# Patient Record
Sex: Female | Born: 1990 | Race: Black or African American | Hispanic: No | Marital: Single | Smoking: Never smoker
Health system: Southern US, Community
[De-identification: ages and names within clinical notes are randomized; demographics above are authoritative.]

## PROBLEM LIST (undated history)

## (undated) ENCOUNTER — Inpatient Hospital Stay: Payer: Self-pay

## (undated) DIAGNOSIS — D649 Anemia, unspecified: Secondary | ICD-10-CM

## (undated) DIAGNOSIS — J45909 Unspecified asthma, uncomplicated: Secondary | ICD-10-CM

## (undated) HISTORY — DX: Anemia, unspecified: D64.9

## (undated) HISTORY — PX: WISDOM TOOTH EXTRACTION: SHX21

---

## 2012-10-25 LAB — OB RESULTS CONSOLE HEPATITIS B SURFACE ANTIGEN: Hepatitis B Surface Ag: NEGATIVE

## 2012-10-25 LAB — OB RESULTS CONSOLE RPR: RPR: NONREACTIVE

## 2012-10-25 LAB — OB RESULTS CONSOLE RUBELLA ANTIBODY, IGM: RUBELLA: IMMUNE

## 2012-10-25 LAB — OB RESULTS CONSOLE ABO/RH: RH TYPE: POSITIVE

## 2012-10-25 LAB — OB RESULTS CONSOLE HIV ANTIBODY (ROUTINE TESTING): HIV: NONREACTIVE

## 2012-10-25 LAB — OB RESULTS CONSOLE ANTIBODY SCREEN: Antibody Screen: NEGATIVE

## 2012-11-12 ENCOUNTER — Inpatient Hospital Stay (HOSPITAL_COMMUNITY): Admission: AD | Admit: 2012-11-12 | Payer: Self-pay | Source: Ambulatory Visit | Admitting: Obstetrics & Gynecology

## 2013-05-06 LAB — OB RESULTS CONSOLE GC/CHLAMYDIA
Chlamydia: NEGATIVE
Gonorrhea: NEGATIVE

## 2013-05-12 LAB — OB RESULTS CONSOLE GBS: GBS: NEGATIVE

## 2013-05-29 ENCOUNTER — Encounter (HOSPITAL_COMMUNITY): Payer: Self-pay | Admitting: *Deleted

## 2013-05-29 ENCOUNTER — Inpatient Hospital Stay (HOSPITAL_COMMUNITY)
Admission: AD | Admit: 2013-05-29 | Discharge: 2013-05-29 | Disposition: A | Discharge status: Home / Self Care | Payer: Medicaid Other | Source: Ambulatory Visit | Attending: Obstetrics and Gynecology | Admitting: Obstetrics and Gynecology

## 2013-05-29 DIAGNOSIS — M549 Dorsalgia, unspecified: Secondary | ICD-10-CM

## 2013-05-29 DIAGNOSIS — O99891 Other specified diseases and conditions complicating pregnancy: Secondary | ICD-10-CM | POA: Insufficient documentation

## 2013-05-29 DIAGNOSIS — O9989 Other specified diseases and conditions complicating pregnancy, childbirth and the puerperium: Principal | ICD-10-CM

## 2013-05-29 NOTE — L&D Delivery Note (Signed)
Delivery Note At 12:11 PM a viable and healthy female was delivered via Vaginal, Spontaneous Delivery (Presentation: Left Occiput Anterior).  APGAR: 9, 9; weight .   Placenta status: Intact, Spontaneous.  Cord: 3 vessels with the following complications: None.  Cord pH: na  Anesthesia: Epidural  Episiotomy: None Lacerations: 2nd degree;Perineal Suture Repair: 2.0 vicryl rapide Est. Blood Loss (mL): 200  Mom to postpartum.  Baby to Couplet care / Skin to Skin.  Merrillyn Ackerley J 06/09/2013, 12:22 PM

## 2013-05-29 NOTE — MAU Note (Signed)
PT SAYS SHE STARTED HURTING BAD AT [facility].  VE IN OFFICE ON Monday- DR [name]    - CLOSED .   LAST SEX-  DEC. DENIES HSV AND MRSA.

## 2013-05-29 NOTE — MAU Provider Note (Signed)
  History     CSN: [id number]  Arrival date and time: 05/29/13 0151   No chief complaint on file.  HPI  Backache and ctx tonight - could not sleep  History reviewed. No pertinent past medical history.  History reviewed. No pertinent past surgical history.  History reviewed. No pertinent family history.  History  Substance Use Topics  . Smoking status: Never Smoker   . Smokeless tobacco: Not on file  . Alcohol Use: No    Allergies: Not on File  Prescriptions prior to admission  Medication Sig Dispense Refill  . ferrous fumarate (HEMOCYTE - 106 MG FE) 325 (106 FE) MG TABS tablet Take 1 tablet by mouth.      . Multiple Vitamin (MULTIVITAMIN) tablet Take 1 tablet by mouth daily.        ROS Physical Exam   Blood pressure 104/75, pulse 71, temperature 98 F (36.7 C), temperature source Oral, resp. rate 20, height 5\' 6"  (1.676 m), weight 76.204 kg (168 lb).  Physical Exam Alert and oriented NAD or pain Cervical exam by nurse - no cervical change from last exam in office  FHR baseline 120-125 - moderate variability / + accels Variable during cervical exam - questionable maternal versus loss contact  MAU Course  Procedures  NST - reactive Labor check - no evidence of labor  Assessment and Plan  38 weeks Backache - reviewed late gestation pain and etiology of back pain in late gestation Comfort measures reviewed  Instructed to call office number prior to hospital to avoid false labor trips Can offer comfort measures over phone to try prior to hospital visit Labor signs reviewed  DC home   Marlinda MikeBAILEY, Kwana Ringel 05/29/2013, 3:13 AM

## 2013-05-29 NOTE — Discharge Instructions (Signed)
Back Pain in Pregnancy Back pain during pregnancy is common. It happens in about half of all pregnancies. It is important for you and your baby that you remain active during your pregnancy.Back pain may be caused by several factors related to changes during your pregnancy.Fortunately, the pain is likely to get better after you deliver.  Low back pain usually occurs between the fifth and seventh months of pregnancy. It will become more constant in last 1-2 weeks prior to labor when the baby "drops" into the pelvis before labor.   CAUSES   When you are pregnant, your body produces a hormone called relaxin. This hormonemakes the ligaments connecting the low back and pubic bones more flexible.When your ligaments are loose, your muscles need to work harder to support your back. Soreness in your back can come from tired muscles.   As the baby grows, it puts pressure on the nerves and blood vessels in your pelvis. This can cause back pain.  As the baby grows and gets heavier during pregnancy, the uterus pushes the stomach muscles forward and changes your center of gravity.  SYMPTOMS  Lumbar pain during pregnancy Lumbar pain during pregnancy usually occurs at or above the waist in the center of the back. There may be pain and numbness that radiates into your leg or foot. This is similar to low back pain experienced by non-pregnant women. It usually increases with sitting for long periods of time, standing, or repetitive lifting. Tenderness may also be present in the muscles along your upper back.  Posterior pelvic pain during pregnancy Pain in the back of the pelvis is more common than lumbar pain in pregnancy. It is a deep pain felt in your side at the waistline, or across the tailbone (sacrum), or in both places. You may have pain on one or both sides. This pain can also go into the buttocks and backs of the upper thighs. Pubic and groin pain may also be present. The pain does not quickly resolve with  rest, and morning stiffness may also be present.   However, if you are at term with the pregnancy, constant low back pain can be the beginning of early labor, and you should monitor for labor signs after several days of back pain. HOME CARE INSTRUCTIONS  Do not stand in one place for long periods of time.  Do not wear high heels.  Sit in chairs with good posture. Use a pillow on your lower back if necessary.   Try sleeping on your side, preferably the left side, with a pillow or two between your legs.  Listen to your body when lifting. Squat down when picking up something from the floor. Do not bend over.  Eat a healthy diet.   Use heat or cold packs 3 to 4 times a day for 15 minutes to help with the pain.  Only take over-the-counter or prescription medicines for pain, discomfort - Tylenol 2 tablets every 6 hours if needed.    SEEK MEDICAL CARE IF:   You are not able to do most of your daily activities, even when taking the pain medicine you were given.  You need a referral to a physical therapist or chiropractor.  You want to try acupuncture. SEEK IMMEDIATE MEDICAL CARE IF:  You develop numbness, tingling, weakness, or problems with the use of your arms or legs.  You develop severe back pain that is no longer relieved with medicines.  You develop shortness of breath, dizziness, or fainting.  You develop  nausea, vomiting, or sweating.  Your water breaks or vaginal bleeding.  You have numbness that travels down your leg.  Back pain is fairly common during pregnancy.    Document Released: 08/23/2005 Document Revised: 08/07/2011 Document Reviewed: 10/04/2010 [facility] Patient Information 2014 [employer] [place].

## 2013-05-30 NOTE — MAU Provider Note (Signed)
Reviewed and agree with note and plan. V.Mitchell Iwanicki, MD  

## 2013-06-05 ENCOUNTER — Encounter (HOSPITAL_COMMUNITY): Payer: Self-pay | Admitting: *Deleted

## 2013-06-05 ENCOUNTER — Inpatient Hospital Stay (HOSPITAL_COMMUNITY): Payer: BC Managed Care – PPO

## 2013-06-05 ENCOUNTER — Inpatient Hospital Stay (HOSPITAL_COMMUNITY)
Admission: AD | Admit: 2013-06-05 | Discharge: 2013-06-05 | Disposition: A | Discharge status: Home / Self Care | Payer: BC Managed Care – PPO | Source: Ambulatory Visit | Attending: Obstetrics and Gynecology | Admitting: Obstetrics and Gynecology

## 2013-06-05 DIAGNOSIS — B3731 Acute candidiasis of vulva and vagina: Secondary | ICD-10-CM | POA: Insufficient documentation

## 2013-06-05 DIAGNOSIS — O2390 Unspecified genitourinary tract infection in pregnancy, unspecified trimester: Secondary | ICD-10-CM | POA: Insufficient documentation

## 2013-06-05 DIAGNOSIS — B373 Candidiasis of vulva and vagina: Secondary | ICD-10-CM | POA: Insufficient documentation

## 2013-06-05 DIAGNOSIS — O479 False labor, unspecified: Secondary | ICD-10-CM | POA: Insufficient documentation

## 2013-06-05 LAB — AMNISURE RUPTURE OF MEMBRANE (ROM) NOT AT ARMC: Amnisure ROM: POSITIVE

## 2013-06-05 MED ORDER — TERCONAZOLE 0.4 % VA CREA: 1.0000 | TOPICAL_CREAM | Freq: Every day | VAGINAL | Status: DC

## 2013-06-05 NOTE — MAU Note (Signed)
WHEN TRYING TO COLLECTING  AMNISURE- MET RESISTANCE-  SO PT TRIED-   COULD  NOT INSERT  Q-TIP WITHOUT HURTING- ONLY  ABOUT 2 INCHES- SO I TRIED -  STOPPED - REMOVE Q-TIP- BRIGHT RED BLOOD.  TO B-ROOM- AFTER URINATING - FLUID ON FLOOR-  NEG FOR FERN.    SENT AMNISURE-   BACK ON MONITOR.

## 2013-06-05 NOTE — H&P (Signed)
CC: " I think my water broke."  HPI: 23 yo G1 at 39'6 presents with question ROM since this am. Pt notes scant d/c vs fluid PV since then. Pt seen in office yesterday, treated w/ topical steroid for vulvitis. Pt notes no bleeding, no contractions, no fevers, no abominal pain.  Overall uncomplicated pregnancy  PE: Filed Vitals:   06/05/13 0911  BP: 111/80  Pulse: 81  Temp: 98.4 F (36.9 C)  TempSrc: Oral  Resp: 18  Height: 5\' 6"  (1.676 m)  Weight: 75.569 kg (166 lb 9.6 oz)   Gen: well appearing, no distress Back: no CVAT  Abd: no RUQ pain, gravid, no fundal tenderness GU: thick, white d/c in vagina. Pool neg. cvx FT ext os, int os closed, cvx long, vtx high in station  Toco: q 7min Fh: 130's, + accels, no decels, 10 beat var  U/s: AFI 10, vtx, BPP 8/8 Wet prep: yeast, scant WBC, numerous lactobacilli, no clud Dry slide" fern neg  amnisure- blood stained: positive   A/P: Yeast vaginitis, no evidence ROM. Amnisure erroneous due to blood. Defer to clinical exam, wet prep, AFI.  Reactive fetal testing F/u routine in office, planned IOL at 41 wks.   Allison [name] A. 06/05/2013 2:46 PM

## 2013-06-05 NOTE — MAU Note (Signed)
Pt reports her waer broke about 7am this morning. Clear fluid out. C/O mild contractions and reports good fetal movement

## 2013-06-05 NOTE — MAU Note (Addendum)
PT SAYS   SHE WAS ASLEEP AND AWOKE  WITH A GUSH-  AT 0700  CLEAR.       VE IN OFFICE - CLOSED -  DR [name] IS PRIMARY. DENIES HSV AND MRSA.

## 2013-06-09 ENCOUNTER — Inpatient Hospital Stay (HOSPITAL_COMMUNITY): Payer: Medicaid Other | Admitting: Anesthesiology

## 2013-06-09 ENCOUNTER — Encounter (HOSPITAL_COMMUNITY): Payer: Self-pay | Admitting: *Deleted

## 2013-06-09 ENCOUNTER — Inpatient Hospital Stay (HOSPITAL_COMMUNITY)
Admission: AD | Admit: 2013-06-09 | Discharge: 2013-06-11 | DRG: 775 | Disposition: A | Discharge status: Home / Self Care | Payer: Medicaid Other | Source: Ambulatory Visit | Attending: Obstetrics and Gynecology | Admitting: Obstetrics and Gynecology

## 2013-06-09 ENCOUNTER — Encounter (HOSPITAL_COMMUNITY): Payer: Medicaid Other | Admitting: Anesthesiology

## 2013-06-09 DIAGNOSIS — O9903 Anemia complicating the puerperium: Secondary | ICD-10-CM | POA: Diagnosis not present

## 2013-06-09 DIAGNOSIS — D649 Anemia, unspecified: Secondary | ICD-10-CM | POA: Diagnosis not present

## 2013-06-09 HISTORY — DX: Unspecified asthma, uncomplicated: J45.909

## 2013-06-09 LAB — CBC
HEMATOCRIT: 35.5 % — AB (ref 36.0–46.0)
HEMOGLOBIN: 11.6 g/dL — AB (ref 12.0–15.0)
MCH: 23.4 pg — ABNORMAL LOW (ref 26.0–34.0)
MCHC: 32.7 g/dL (ref 30.0–36.0)
MCV: 71.6 fL — ABNORMAL LOW (ref 78.0–100.0)
Platelets: 142 10*3/uL — ABNORMAL LOW (ref 150–400)
RBC: 4.96 MIL/uL (ref 3.87–5.11)
RDW: 14.4 % (ref 11.5–15.5)
WBC: 6.3 10*3/uL (ref 4.0–10.5)

## 2013-06-09 LAB — OB RESULTS CONSOLE RUBELLA ANTIBODY, IGM: Rubella: NON-IMMUNE/NOT IMMUNE

## 2013-06-09 LAB — RPR: RPR Ser Ql: NONREACTIVE

## 2013-06-09 LAB — AMNISURE RUPTURE OF MEMBRANE (ROM) NOT AT ARMC: Amnisure ROM: POSITIVE

## 2013-06-09 LAB — ABO/RH: ABO/RH(D): O POS

## 2013-06-09 MED ORDER — LACTATED RINGERS IV SOLN
500.0000 mL | Freq: Once | INTRAVENOUS | Status: AC
  Administered 2013-06-09: 500 mL via INTRAVENOUS

## 2013-06-09 MED ORDER — ONDANSETRON HCL 4 MG/2ML IJ SOLN: 4.0000 mg | Freq: Four times a day (QID) | INTRAMUSCULAR | Status: DC | PRN

## 2013-06-09 MED ORDER — TETANUS-DIPHTH-ACELL PERTUSSIS 5-2.5-18.5 LF-MCG/0.5 IM SUSP: 0.5000 mL | Freq: Once | INTRAMUSCULAR | Status: DC

## 2013-06-09 MED ORDER — DIPHENHYDRAMINE HCL 25 MG PO CAPS: 25.0000 mg | ORAL_CAPSULE | Freq: Four times a day (QID) | ORAL | Status: DC | PRN

## 2013-06-09 MED ORDER — DIPHENHYDRAMINE HCL 50 MG/ML IJ SOLN: 12.5000 mg | INTRAMUSCULAR | Status: DC | PRN

## 2013-06-09 MED ORDER — LACTATED RINGERS IV SOLN: 500.0000 mL | INTRAVENOUS | Status: DC | PRN

## 2013-06-09 MED ORDER — IBUPROFEN 600 MG PO TABS
600.0000 mg | ORAL_TABLET | Freq: Four times a day (QID) | ORAL | Status: DC
  Administered 2013-06-09 – 2013-06-11 (×7): 600 mg via ORAL
  Filled 2013-06-09 (×7): qty 1

## 2013-06-09 MED ORDER — WITCH HAZEL-GLYCERIN EX PADS: 1.0000 "application " | MEDICATED_PAD | CUTANEOUS | Status: DC | PRN

## 2013-06-09 MED ORDER — OXYTOCIN 40 UNITS IN LACTATED RINGERS INFUSION - SIMPLE MED
62.5000 mL/h | INTRAVENOUS | Status: DC
  Administered 2013-06-09: 999 mL/h via INTRAVENOUS
  Filled 2013-06-09: qty 1000

## 2013-06-09 MED ORDER — LACTATED RINGERS IV SOLN
INTRAVENOUS | Status: DC
  Administered 2013-06-09 (×3): via INTRAVENOUS

## 2013-06-09 MED ORDER — PRENATAL MULTIVITAMIN CH
1.0000 | ORAL_TABLET | Freq: Every day | ORAL | Status: DC
  Administered 2013-06-10 – 2013-06-11 (×2): 1 via ORAL
  Filled 2013-06-09 (×2): qty 1

## 2013-06-09 MED ORDER — OXYCODONE-ACETAMINOPHEN 5-325 MG PO TABS: 1.0000 | ORAL_TABLET | ORAL | Status: DC | PRN

## 2013-06-09 MED ORDER — EPHEDRINE 5 MG/ML INJ
10.0000 mg | INTRAVENOUS | Status: DC | PRN
  Filled 2013-06-09: qty 4
  Filled 2013-06-09: qty 2

## 2013-06-09 MED ORDER — CITRIC ACID-SODIUM CITRATE 334-500 MG/5ML PO SOLN: 30.0000 mL | ORAL | Status: DC | PRN

## 2013-06-09 MED ORDER — PHENYLEPHRINE 40 MCG/ML (10ML) SYRINGE FOR IV PUSH (FOR BLOOD PRESSURE SUPPORT)
80.0000 ug | PREFILLED_SYRINGE | INTRAVENOUS | Status: DC | PRN
  Filled 2013-06-09: qty 2
  Filled 2013-06-09: qty 10

## 2013-06-09 MED ORDER — ZOLPIDEM TARTRATE 5 MG PO TABS: 5.0000 mg | ORAL_TABLET | Freq: Every evening | ORAL | Status: DC | PRN

## 2013-06-09 MED ORDER — IBUPROFEN 600 MG PO TABS: 600.0000 mg | ORAL_TABLET | Freq: Four times a day (QID) | ORAL | Status: DC | PRN

## 2013-06-09 MED ORDER — SENNOSIDES-DOCUSATE SODIUM 8.6-50 MG PO TABS
2.0000 | ORAL_TABLET | ORAL | Status: DC
  Administered 2013-06-09 – 2013-06-10 (×2): 2 via ORAL
  Filled 2013-06-09 (×2): qty 2

## 2013-06-09 MED ORDER — FENTANYL 2.5 MCG/ML BUPIVACAINE 1/10 % EPIDURAL INFUSION (WH - ANES)
14.0000 mL/h | INTRAMUSCULAR | Status: DC | PRN
  Administered 2013-06-09: 14 mL/h via EPIDURAL
  Filled 2013-06-09: qty 125

## 2013-06-09 MED ORDER — METHYLERGONOVINE MALEATE 0.2 MG/ML IJ SOLN: 0.2000 mg | INTRAMUSCULAR | Status: DC | PRN

## 2013-06-09 MED ORDER — BENZOCAINE-MENTHOL 20-0.5 % EX AERO: 1.0000 "application " | INHALATION_SPRAY | CUTANEOUS | Status: DC | PRN

## 2013-06-09 MED ORDER — ONDANSETRON HCL 4 MG PO TABS: 4.0000 mg | ORAL_TABLET | ORAL | Status: DC | PRN

## 2013-06-09 MED ORDER — FLEET ENEMA 7-19 GM/118ML RE ENEM: 1.0000 | ENEMA | RECTAL | Status: DC | PRN

## 2013-06-09 MED ORDER — DIBUCAINE 1 % RE OINT: 1.0000 "application " | TOPICAL_OINTMENT | RECTAL | Status: DC | PRN

## 2013-06-09 MED ORDER — ACETAMINOPHEN 325 MG PO TABS: 650.0000 mg | ORAL_TABLET | ORAL | Status: DC | PRN

## 2013-06-09 MED ORDER — ONDANSETRON HCL 4 MG/2ML IJ SOLN: 4.0000 mg | INTRAMUSCULAR | Status: DC | PRN

## 2013-06-09 MED ORDER — LIDOCAINE HCL (PF) 1 % IJ SOLN
INTRAMUSCULAR | Status: DC | PRN
  Administered 2013-06-09 (×4): 4 mL

## 2013-06-09 MED ORDER — SIMETHICONE 80 MG PO CHEW: 80.0000 mg | CHEWABLE_TABLET | ORAL | Status: DC | PRN

## 2013-06-09 MED ORDER — LANOLIN HYDROUS EX OINT: TOPICAL_OINTMENT | CUTANEOUS | Status: DC | PRN

## 2013-06-09 MED ORDER — METHYLERGONOVINE MALEATE 0.2 MG PO TABS: 0.2000 mg | ORAL_TABLET | ORAL | Status: DC | PRN

## 2013-06-09 MED ORDER — OXYTOCIN BOLUS FROM INFUSION: 500.0000 mL | INTRAVENOUS | Status: DC

## 2013-06-09 MED ORDER — EPHEDRINE 5 MG/ML INJ
10.0000 mg | INTRAVENOUS | Status: DC | PRN
  Filled 2013-06-09: qty 2

## 2013-06-09 MED ORDER — PHENYLEPHRINE 40 MCG/ML (10ML) SYRINGE FOR IV PUSH (FOR BLOOD PRESSURE SUPPORT)
80.0000 ug | PREFILLED_SYRINGE | INTRAVENOUS | Status: DC | PRN
  Filled 2013-06-09: qty 2

## 2013-06-09 MED ORDER — LIDOCAINE HCL (PF) 1 % IJ SOLN
30.0000 mL | INTRAMUSCULAR | Status: DC | PRN
  Filled 2013-06-09 (×2): qty 30

## 2013-06-09 NOTE — H&P (Signed)
Allison [name] is a [age] y.o. female presenting for LOF and labor. Maternal Medical History:  Reason for admission: Rupture of membranes and contractions.   Contractions: Onset was 1-2 hours ago.   Frequency: regular.   Perceived severity is moderate.    Fetal activity: Perceived fetal activity is normal.   Last perceived fetal movement was within the past hour.    Prenatal complications: no prenatal complications Prenatal Complications - Diabetes: none.    OB History   Grav Para Term Preterm Abortions TAB SAB Ect Mult Living   1              Past Medical History  Diagnosis Date  . Asthma     exercise induced, inhaler use 8 years ago   Past Surgical History  Procedure Laterality Date  . Wisdom tooth extraction     Family History: family history is not on file. Social History:  reports that she has never smoked. She does not have any smokeless tobacco history on file. She reports that she does not drink alcohol or use illicit drugs.   Prenatal Transfer Tool  Maternal Diabetes: No Genetic Screening: Normal Maternal Ultrasounds/Referrals: Normal Fetal Ultrasounds or other Referrals:  None Maternal Substance Abuse:  No Significant Maternal Medications:  None Significant Maternal Lab Results:  None Other Comments:  None  Review of Systems  Constitutional: Negative.   HENT: Negative.   Cardiovascular: Negative.   Gastrointestinal: Negative.   Genitourinary: Negative.   All other systems reviewed and are negative.    Dilation: 1.5 Effacement (%): 60 Station: -3 Exam by:: L. Munford RN Blood pressure 113/81, pulse 83, temperature 98.3 F (36.8 C), temperature source Oral, resp. rate 16, height 5\' 7"  (1.702 m), weight 75.751 kg (167 lb), SpO2 100.00%. Maternal Exam:  Uterine Assessment: Contraction strength is moderate.  Contraction frequency is regular.   Abdomen: Patient reports no abdominal tenderness. Fetal presentation: vertex  Introitus: Normal vulva.  Normal vagina.  Pelvis: adequate for delivery.   Cervix: Cervix evaluated by digital exam.     Physical Exam  Constitutional: She is oriented to person, place, and time. She appears well-developed and well-nourished.  HENT:  Head: Normocephalic.  Neck: Normal range of motion.  Cardiovascular: Normal rate and regular rhythm.   Respiratory: Effort normal.  GI: Soft.  Genitourinary: Vagina normal and uterus normal.  Neurological: She is alert and oriented to person, place, and time.  Skin: Skin is warm and dry.  Psychiatric: She has a normal mood and affect.    Prenatal labs: ABO, Rh: O/Positive/-- (05/30 0000) Antibody: Negative (05/30 0000) Rubella: Immune (05/30 0000) RPR: Nonreactive (05/30 0000)  HBsAg: Negative (05/30 0000)  HIV: Non-reactive (05/30 0000)  GBS: Negative (12/15 0000)   Assessment/Plan: Early Labor LOF- ?meconium- pos amnisure Admit to L&D Desires epidural   Allison [name] 06/09/2013, 8:32 AM

## 2013-06-09 NOTE — Anesthesia Postprocedure Evaluation (Signed)
  Anesthesia Post-op Note  Patient: Allison [name]  Procedure(s) Performed: * No procedures listed *  Patient Location: PACU and Mother/Baby  Anesthesia Type:Epidural  Level of Consciousness: awake  Airway and Oxygen Therapy: Patient Spontanous Breathing  Post-op Pain: mild  Post-op Assessment: Patient's Cardiovascular Status Stable and Respiratory Function Stable  Post-op Vital Signs: stable  Complications: No apparent anesthesia complications

## 2013-06-09 NOTE — Progress Notes (Signed)
Allison [name] is a 23 y.o. G1P0 at 4750w3d by LMP admitted for active labor, rupture of membranes  Subjective: comfortable  Objective: BP 113/81  Pulse 83  Temp(Src) 98.3 F (36.8 C) (Oral)  Resp 16  Ht 5\' 7"  (1.702 m)  Wt 75.751 kg (167 lb)  BMI 26.15 kg/m2  SpO2 100%      FHT:  FHR: 125 bpm, variability: moderate,  accelerations:  Present,  decelerations:  Absent UC:   regular, every 3 minutes SVE:   Dilation: 1.5 Effacement (%): 60 Station: -3 Exam by:: L. Munford RN  Labs: Lab Results  Component Value Date   WBC 6.3 06/09/2013   HGB 11.6* 06/09/2013   HCT 35.5* 06/09/2013   MCV 71.6* 06/09/2013   PLT 142* 06/09/2013    Assessment / Plan: Spontaneous labor, progressing normally ? MSF  Labor: Progressing normally Preeclampsia:  no signs or symptoms of toxicity Fetal Wellbeing:  Category I Pain Control:  Epidural I/D:  n/a Anticipated MOD:  NSVD  Shahzain Kiester J 06/09/2013, 8:39 AM

## 2013-06-09 NOTE — Progress Notes (Signed)
Dr. Billy Coastaavon notified patient presents for a labor eval, contractions every 5 minutes. After arrival patient c/o of leaking fluid. Small amount of green fluid (looks like meconium stained fluid). Small sample fern negative. Fetal tracing nonreactive minimal variability with decels. Patient in left lateral position. SVE 1.5/60/-3. Orders for amnisure received and to continue monitor.

## 2013-06-09 NOTE — Lactation Note (Signed)
This note was copied from the chart of Boy Marleni Roddey. Lactation Consultation Note Initial visit at  10 hours of age. Mom rePincus Largeports not having milk and baby is sleepy.  [facility] given and discussed.  Hand expression reveals small amount of colostrum.  Assistance needed to attempt latch in football hold on left breast.  Baby has wide flanged lips with mouth wide open, but does not hold breast and suck.  Baby doesn't suck on gloved finger either.  Encouraged skin to skin with feeding cues.  Baby mouthed and nuzzled at the breast and then sleepy.  Hand expressed 3 mls and spoon fed to baby.  Baby extends tongue well with spoon feeding.  Hand pump used to help stimulate nipple.  Nipples are erect and invert with compression.  Mom to call for assist as needed.   Patient Name: Boy Pincus LargeKourtney Tyner ZOXWR'UToday's Date: 06/09/2013 Reason for consult: Initial assessment   Maternal Data    Feeding Feeding Type: Breast Fed Length of feed:  (no suck established)  LATCH Score/Interventions Latch: Too sleepy or reluctant, no latch achieved, no sucking elicited. Intervention(s): Skin to skin;Teach feeding cues;Waking techniques Intervention(s): Adjust position;Assist with latch  Audible Swallowing: None Intervention(s): Skin to skin;Hand expression  Type of Nipple: Everted at rest and after stimulation Intervention(s): Hand pump  Comfort (Breast/Nipple): Soft / non-tender     Hold (Positioning): No assistance needed to correctly position infant at breast. Intervention(s): Skin to skin;Breastfeeding basics reviewed;Support Pillows  LATCH Score: 6  Lactation Tools Discussed/Used Initiated by:: Franz DellJana Shoptaw RN  Date initiated:: 06/10/13   Consult Status Consult Status: Follow-up Date: 06/10/13 Follow-up type: In-patient    Beverely RisenShoptaw, Arvella MerlesJana Lynn 06/09/2013, 11:50 PM

## 2013-06-09 NOTE — Anesthesia Procedure Notes (Signed)
Epidural Patient location during procedure: OB Start time: 06/09/2013 7:04 AM  Staffing Performed by: anesthesiologist   Preanesthetic Checklist Completed: patient identified, site marked, surgical consent, pre-op evaluation, timeout performed, IV checked, risks and benefits discussed and monitors and equipment checked  Epidural Patient position: sitting Prep: site prepped and draped and DuraPrep Patient monitoring: continuous pulse ox and blood pressure Approach: midline Injection technique: LOR air  Needle:  Needle type: Tuohy  Needle gauge: 17 G Needle length: 9 cm and 9 Needle insertion depth: 5 cm cm Catheter type: closed end flexible Catheter size: 19 Gauge Catheter at skin depth: 10 cm Test dose: negative  Assessment Events: blood not aspirated, injection not painful, no injection resistance, negative IV test and no paresthesia  Additional Notes Discussed risk of headache, infection, bleeding, nerve injury and failed or incomplete block.  Patient voices understanding and wishes to proceed.  Epidural placed easily on first attempt.  No paresthesia.  Patient tolerated procedure well with no apparent complications.  A> Lucee Brissett, MDReason for block:procedure for pain

## 2013-06-09 NOTE — Progress Notes (Signed)
Allison LargeKourtney [name] is a 23 y.o. G1P0 at 5627w3d by LMP admitted for active labor, rupture of membranes  Subjective: Pushing well  Objective: BP 122/86  Pulse 87  Temp(Src) 98.7 F (37.1 C) (Oral)  Resp 20  Ht 5\' 7"  (1.702 m)  Wt 75.751 kg (167 lb)  BMI 26.15 kg/m2  SpO2 100%   Total I/O In: -  Out: 400 [Urine:400]  FHT:  FHR: 145 bpm, variability: moderate,  accelerations:  Present,  decelerations:  Present mild variables with pushing UC:   regular, every 3 minutes SVE:   10/100/+2  Labs: Lab Results  Component Value Date   WBC 6.3 06/09/2013   HGB 11.6* 06/09/2013   HCT 35.5* 06/09/2013   MCV 71.6* 06/09/2013   PLT 142* 06/09/2013    Assessment / Plan: Spontaneous labor, progressing normally MSF  Labor: Progressing normally Preeclampsia:  no signs or symptoms of toxicity Fetal Wellbeing:  Category I Pain Control:  Epidural I/D:  n/a Anticipated MOD:  NSVD  Allison [name] J 06/09/2013, 11:38 AM

## 2013-06-09 NOTE — MAU Note (Signed)
Pt states contractions started around 2240 this evening and have been 4 minutes apart since 0100.  Denies any VB or LOF.

## 2013-06-09 NOTE — Progress Notes (Signed)
Patient ID: Pincus LargeKourtney [name], female   DOB: 04/03/1991, 23 y.o.   MRN: [id number] Circumcision note: Parents counselled. Consent signed. Risks vs benefits of procedure discussed. Decreased risks of UTI, STDs and penile cancer noted. Time out done. Ring block with 1 ml 1% xylocaine without complications. Procedure with Gomco 1.3 without complications. EBL: minimal  Pt tolerated procedure well.

## 2013-06-09 NOTE — Anesthesia Preprocedure Evaluation (Signed)
Anesthesia Evaluation  Patient identified by MRN, date of birth, ID band Patient awake    Reviewed: Allergy & Precautions, H&P , NPO status , Patient's Chart, lab work & pertinent test results, reviewed documented beta blocker date and time   History of Anesthesia Complications Negative for: history of anesthetic complications  Airway Mallampati: III TM Distance: >3 FB Neck ROM: full    Dental  (+) Teeth Intact   Pulmonary asthma (exercise-induced.  No inhaler use in 8 years) ,  breath sounds clear to auscultation        Cardiovascular negative cardio ROS  Rhythm:regular Rate:Normal     Neuro/Psych negative neurological ROS  negative psych ROS   GI/Hepatic negative GI ROS, Neg liver ROS,   Endo/Other  negative endocrine ROS  Renal/GU negative Renal ROS  negative genitourinary   Musculoskeletal   Abdominal   Peds  Hematology negative hematology ROS (+) Thrombocytopenia - plt 142   Anesthesia Other Findings   Reproductive/Obstetrics (+) Pregnancy                           Anesthesia Physical Anesthesia Plan  ASA: II  Anesthesia Plan: Epidural   Post-op Pain Management:    Induction:   Airway Management Planned:   Additional Equipment:   Intra-op Plan:   Post-operative Plan:   Informed Consent: I have reviewed the patients History and Physical, chart, labs and discussed the procedure including the risks, benefits and alternatives for the proposed anesthesia with the patient or authorized representative who has indicated his/her understanding and acceptance.     Plan Discussed with:   Anesthesia Plan Comments:         Anesthesia Quick Evaluation

## 2013-06-10 LAB — CBC
HEMATOCRIT: 30.5 % — AB (ref 36.0–46.0)
HEMOGLOBIN: 10 g/dL — AB (ref 12.0–15.0)
MCH: 23.5 pg — ABNORMAL LOW (ref 26.0–34.0)
MCHC: 32.8 g/dL (ref 30.0–36.0)
MCV: 71.6 fL — ABNORMAL LOW (ref 78.0–100.0)
Platelets: 121 10*3/uL — ABNORMAL LOW (ref 150–400)
RBC: 4.26 MIL/uL (ref 3.87–5.11)
RDW: 14.3 % (ref 11.5–15.5)
WBC: 10.8 10*3/uL — AB (ref 4.0–10.5)

## 2013-06-10 MED ORDER — POLYSACCHARIDE IRON COMPLEX 150 MG PO CAPS
150.0000 mg | ORAL_CAPSULE | Freq: Every day | ORAL | Status: DC
  Administered 2013-06-10 – 2013-06-11 (×2): 150 mg via ORAL
  Filled 2013-06-10 (×2): qty 1

## 2013-06-10 NOTE — Progress Notes (Addendum)
PPD #1- SVD  Subjective:   Reports feeling well Tolerating po/ No nausea or vomiting Bleeding is moderate Pain controlled with Motrin and Percocet Up ad lib / ambulatory / voiding without problems Newborn: breastfeeding  / Circumcision: planning outpatient   Objective:   VS:  VS:  Filed Vitals:   06/09/13 1424 06/09/13 1528 06/09/13 1829 06/10/13 0555  BP: 119/81 101/69 108/74 101/67  Pulse: 78 101 94 76  Temp: 99 F (37.2 C) 99.8 F (37.7 C) 98.9 F (37.2 C) 98.2 F (36.8 C)  TempSrc: Oral Oral Oral Oral  Resp: 16 18 18 18   Height:      Weight:      SpO2:        LABS:  Recent Labs  06/09/13 0505 06/10/13 0540  WBC 6.3 10.8*  HGB 11.6* 10.0*  PLT 142* 121*   Blood type: --/--/O POS (01/12 0441) Rubella: Nonimmune (01/12 1334)   I&O: Intake/Output     01/12 0701 - 01/13 0700 01/13 0701 - 01/14 0700   Urine (mL/kg/hr) 400 (0.2)    Blood 200 (0.1)    Total Output 600     Net -600          Urine Occurrence 2 x      Physical Exam: Alert and oriented x3 Abdomen: soft, non-tender, non-distended  Fundus: firm, non-tender, U-1 Perineum: Well approximated, no significant erythema, edema, or drainage; healing well. Lochia: small Extremities: no edema, no calf pain or tenderness    Assessment:  PPD # 1G1P1001/ S/P:spontaneous vaginal, 2nd degree laceration Mild anemia  Doing well    Plan: Continue routine post partum orders Start Niferex daily Anticipate D/C home tomorrow   Allison LarryBHAMBRI, Doyal Saric, N MSN, CNM 06/10/2013, 10:44 AM

## 2013-06-11 MED ORDER — MEASLES, MUMPS & RUBELLA VAC ~~LOC~~ INJ
0.5000 mL | INJECTION | Freq: Once | SUBCUTANEOUS | Status: AC
  Administered 2013-06-11: 0.5 mL via SUBCUTANEOUS
  Filled 2013-06-11: qty 0.5

## 2013-06-11 MED ORDER — IBUPROFEN 600 MG PO TABS: 600.0000 mg | ORAL_TABLET | Freq: Three times a day (TID) | ORAL | Status: DC | PRN

## 2013-06-11 MED ORDER — OXYCODONE-ACETAMINOPHEN 5-325 MG PO TABS: 1.0000 | ORAL_TABLET | ORAL | Status: DC | PRN

## 2013-06-11 NOTE — Discharge Summary (Signed)
Reviewed and agree with note and plan. V.Shanvi Moyd, MD  

## 2013-06-11 NOTE — Progress Notes (Signed)
PPD 2 SVD  S:  Reports feeling well             Tolerating po/ No nausea or vomiting             Bleeding is light             Pain controlled with motrin and percocet             Up ad lib / ambulatory / voiding QS  Newborn breast feeding   O:               VS: BP 111/77  Pulse 86  Temp(Src) 97.9 F (36.6 C) (Oral)  Resp 18  Ht 5\' 7"  (1.702 m)  Wt 167 lb (75.751 kg)  BMI 26.15 kg/m2  SpO2 100%   LABS:              Recent Labs  06/09/13 0505 06/10/13 0540  WBC 6.3 10.8*  HGB 11.6* 10.0*  PLT 142* 121*               Blood type: --/--/O POS (01/12 0441)  Rubella: Nonimmune (01/12 1334)                             Physical Exam:             Alert and oriented X3  Lungs: Clear and unlabored  Heart: regular rate and rhythm / no mumurs  Abdomen: soft, non-tender, non-distended              Fundus: firm, non-tender, U-1  Perineum: mild edema  Lochia: light  Extremities: no edema, no calf pain or tenderness    A: PPD # 2   Doing well - stable status  P: Routine post partum orders  DC home  Marlinda MikeBAILEY, Hamdi Kley CNM, MSN, [facility]ACNM 06/11/2013, 9:56 AM

## 2013-06-11 NOTE — Discharge Summary (Signed)
Obstetric Discharge Summary  Reason for Admission: onset of labor Prenatal Procedures: none Intrapartum Procedures: spontaneous vaginal delivery Postpartum Procedures: none Complications-Operative and Postpartum: 2nd degree perineal laceration Hemoglobin  Date Value Range Status  06/10/2013 10.0* 12.0 - 15.0 g/dL Final     HCT  Date Value Range Status  06/10/2013 30.5* 36.0 - 46.0 % Final    Physical Exam:  General: alert, cooperative and no distress Lochia: appropriate Uterine Fundus: firm Incision: healing well DVT Evaluation: No evidence of DVT seen on physical exam.  Discharge Diagnoses: Term Pregnancy-delivered  Discharge Information: Date: 06/11/2013 Activity: pelvic rest Diet: routine Medications: PNV, Ibuprofen, Iron and Percocet Condition: stable Instructions: refer to practice specific booklet Discharge to: home Follow-up Information   Follow up with Allison R, MD. Schedule an appointment as soon as possible for a visit in 6 weeks.   Specialty:  Obstetrics and Gynecology   Contact information:   [address] [place] [address] [phone]       Newborn Data: Live born female  Birth Weight: 6 lb 10 oz (3005 g) APGAR: 9, 9  Home with mother.  Allison [name], Allison [name] 06/11/2013, 9:59 AM

## 2014-03-30 ENCOUNTER — Encounter (HOSPITAL_COMMUNITY): Payer: Self-pay | Admitting: *Deleted

## 2015-05-14 ENCOUNTER — Emergency Department
Admission: EM | Admit: 2015-05-14 | Discharge: 2015-05-14 | Disposition: A | Discharge status: Home / Self Care | Payer: PRIVATE HEALTH INSURANCE | Attending: Emergency Medicine | Admitting: Emergency Medicine

## 2015-05-14 ENCOUNTER — Encounter: Payer: Self-pay | Admitting: Emergency Medicine

## 2015-05-14 ENCOUNTER — Emergency Department: Payer: PRIVATE HEALTH INSURANCE

## 2015-05-14 ENCOUNTER — Ambulatory Visit: Payer: Medicaid Other | Admitting: Physician Assistant

## 2015-05-14 DIAGNOSIS — Z79899 Other long term (current) drug therapy: Secondary | ICD-10-CM | POA: Diagnosis not present

## 2015-05-14 DIAGNOSIS — R059 Cough, unspecified: Secondary | ICD-10-CM

## 2015-05-14 DIAGNOSIS — Z3202 Encounter for pregnancy test, result negative: Secondary | ICD-10-CM | POA: Diagnosis not present

## 2015-05-14 DIAGNOSIS — R05 Cough: Secondary | ICD-10-CM | POA: Insufficient documentation

## 2015-05-14 DIAGNOSIS — Z792 Long term (current) use of antibiotics: Secondary | ICD-10-CM | POA: Insufficient documentation

## 2015-05-14 DIAGNOSIS — R1012 Left upper quadrant pain: Secondary | ICD-10-CM | POA: Insufficient documentation

## 2015-05-14 LAB — CBC
HCT: 35.4 % (ref 35.0–47.0)
Hemoglobin: 10.8 g/dL — ABNORMAL LOW (ref 12.0–16.0)
MCH: 21.9 pg — ABNORMAL LOW (ref 26.0–34.0)
MCHC: 30.6 g/dL — ABNORMAL LOW (ref 32.0–36.0)
MCV: 71.6 fL — ABNORMAL LOW (ref 80.0–100.0)
PLATELETS: 224 10*3/uL (ref 150–440)
RBC: 4.94 MIL/uL (ref 3.80–5.20)
RDW: 14.6 % — AB (ref 11.5–14.5)
WBC: 6.5 10*3/uL (ref 3.6–11.0)

## 2015-05-14 LAB — POCT PREGNANCY, URINE: Preg Test, Ur: NEGATIVE

## 2015-05-14 LAB — COMPREHENSIVE METABOLIC PANEL WITH GFR
ALT: 11 U/L — ABNORMAL LOW (ref 14–54)
AST: 16 U/L (ref 15–41)
Albumin: 4.2 g/dL (ref 3.5–5.0)
Alkaline Phosphatase: 57 U/L (ref 38–126)
Anion gap: 8 (ref 5–15)
BUN: 5 mg/dL — ABNORMAL LOW (ref 6–20)
CO2: 24 mmol/L (ref 22–32)
Calcium: 8.9 mg/dL (ref 8.9–10.3)
Chloride: 105 mmol/L (ref 101–111)
Creatinine, Ser: 0.52 mg/dL (ref 0.44–1.00)
GFR calc Af Amer: >60 mL/min
GFR calc non Af Amer: >60 mL/min
Glucose, Bld: 90 mg/dL (ref 65–99)
Potassium: 3.5 mmol/L (ref 3.5–5.1)
Sodium: 137 mmol/L (ref 135–145)
Total Bilirubin: 0.9 mg/dL (ref 0.3–1.2)
Total Protein: 7.9 g/dL (ref 6.5–8.1)

## 2015-05-14 MED ORDER — SENNOSIDES-DOCUSATE SODIUM 8.6-50 MG PO TABS: 2.0000 | ORAL_TABLET | Freq: Every day | ORAL | Status: AC | PRN

## 2015-05-14 MED ORDER — KETOROLAC TROMETHAMINE 30 MG/ML IJ SOLN
30.0000 mg | Freq: Once | INTRAMUSCULAR | Status: AC
  Administered 2015-05-14: 30 mg via INTRAVENOUS
  Filled 2015-05-14: qty 1

## 2015-05-14 MED ORDER — SODIUM CHLORIDE 0.9 % IV BOLUS (SEPSIS)
1000.0000 mL | Freq: Once | INTRAVENOUS | Status: AC
  Administered 2015-05-14: 1000 mL via INTRAVENOUS

## 2015-05-14 MED ORDER — SIMETHICONE 80 MG PO CHEW: 80.0000 mg | CHEWABLE_TABLET | Freq: Four times a day (QID) | ORAL | Status: DC | PRN

## 2015-05-14 NOTE — ED Provider Notes (Signed)
[facility] Emergency Department Provider Note  ____________________________________________  Time seen: Approximately 11:55 AM  I have reviewed the triage vital signs and the nursing notes.   HISTORY  Chief Complaint Abdominal Pain    HPI Allison [name] is a 24 y.o. female , otherwise healthy, presenting with left upper quadrant pain. Patient reports that since yesterday she has had intermittent episodes of left upper quadrant pain described as "like contractions" that lasts for several seconds and resolve spontaneously. She eats a low fiber diet with a large amount of fried foods as a base of her diet. She denies nausea, vomiting, diarrhea, fever or chills, urinary symptoms or change in her vaginal discharge. LMP was 2 weeks ago. Patient also reports that she has been having hard stools and a decreased number of bowel movements.  Positive nonproductive cough w/ congestion but no sore throat or fever over the past few days.     Past Medical History  Diagnosis Date  . Asthma     exercise induced, inhaler use 8 years ago    Patient Active Problem List   Diagnosis Date Noted  . Postpartum care following vaginal delivery (1/12) 06/10/2013  . Indication for care in labor or delivery 06/09/2013    Past Surgical History  Procedure Laterality Date  . Wisdom tooth extraction      Current Outpatient Rx  Name  Route  Sig  Dispense  Refill  . ferrous fumarate (HEMOCYTE - 106 MG FE) 325 (106 FE) MG TABS tablet   Oral   Take 1 tablet by mouth.         Marland Kitchen. ibuprofen (ADVIL,MOTRIN) 600 MG tablet   Oral   Take 1 tablet (600 mg total) by mouth every 8 (eight) hours as needed.   30 tablet   0   . oxyCODONE-acetaminophen (PERCOCET/ROXICET) 5-325 MG per tablet   Oral   Take 1 tablet by mouth every 4 (four) hours as needed for severe pain (moderate - severe pain).   20 tablet   0   . Prenatal Vit-Fe Fumarate-FA (PRENATAL MULTIVITAMIN) TABS tablet   Oral    Take 1 tablet by mouth daily at 12 noon.         . senna-docusate (SENOKOT-S) 8.6-50 MG tablet   Oral   Take 2 tablets by mouth daily as needed for mild constipation.   20 tablet   0   . simethicone (GAS-X) 80 MG chewable tablet   Oral   Chew 1 tablet (80 mg total) by mouth 4 (four) times daily as needed for flatulence.   15 tablet   0   . terconazole (TERAZOL 7) 0.4 % vaginal cream   Vaginal   Place 1 applicator vaginally at bedtime.   45 g   0     Apply nightly for 7 nights.     Allergies Dust mite extract and Tree extract  No family history on file.  Social History Social History  Substance Use Topics  . Smoking status: Never Smoker   . Smokeless tobacco: None  . Alcohol Use: No    Review of Systems Constitutional: No fever/chills. No lightheadedness or syncope. Eyes: No visual changes. ENT: No sore throat. Cardiovascular: Denies chest pain, palpitations. Respiratory: Denies shortness of breath.  No cough. Gastrointestinal: Nausea blood upper quadrant abdominal pain.  No nausea, no vomiting.  No diarrhea.  Nausea of constipation. Genitourinary: Negative for dysuria. Negative for vaginal discharge. Musculoskeletal: Negative for back pain. Skin: Negative for rash. Neurological:  Negative for headaches, focal weakness or numbness.  10-point ROS otherwise negative.  ____________________________________________   PHYSICAL EXAM:  VITAL SIGNS: ED Triage Vitals  Enc Vitals Group     BP 05/14/15 1129 104/70 mmHg     Pulse Rate 05/14/15 1129 87     Resp 05/14/15 1129 20     Temp 05/14/15 1129 98.4 F (36.9 C)     Temp Source 05/14/15 1129 Oral     SpO2 05/14/15 1129 100 %     Weight 05/14/15 1127 140 lb (63.504 kg)     Height 05/14/15 1127  (1.702 m)     Head Cir --      Peak Flow --      Pain Score 05/14/15 1127 7     Pain Loc --      Pain Edu? --      Excl. in GC? --     Constitutional: Alert and oriented. Well appearing and in no acute  distress. Answer question appropriately. Eyes: Conjunctivae are normal.  EOMI. no scleral icterus. Head: Atraumatic. Nose: No congestion/rhinnorhea. Mouth/Throat: Mucous membranes are moist.  Neck: No stridor.  Supple.   Cardiovascular: Normal rate, regular rhythm. No murmurs, rubs or gallops. No palpable or reproducible pain over the lower left chest wall. Respiratory: Normal respiratory effort.  No retractions. Lungs CTAB.  No wheezes, rales or ronchi. Gastrointestinal: Soft, nondistended. No reproducible tenderness to palpation. No guarding or rebound, peritoneal signs. Negative Murphy sign. Musculoskeletal: No LE edema.  Neurologic:  Normal speech and language. No gross focal neurologic deficits are appreciated.  Skin:  Skin is warm, dry and intact. No rash noted. Psychiatric: Mood and affect are normal. Speech and behavior are normal.  Normal judgement.  ____________________________________________   LABS (all labs ordered are listed, but only abnormal results are displayed)  Labs Reviewed  CBC - Abnormal; Notable for the following:    Hemoglobin 10.8 (*)    MCV 71.6 (*)    MCH 21.9 (*)    MCHC 30.6 (*)    RDW 14.6 (*)    All other components within normal limits  COMPREHENSIVE METABOLIC PANEL - Abnormal; Notable for the following:    BUN 5 (*)    ALT 11 (*)    All other components within normal limits  POC URINE PREG, ED  POCT PREGNANCY, URINE   ____________________________________________  EKG  Not indicated ____________________________________________  RADIOLOGY  Dg Chest 2 View  05/14/2015  CLINICAL DATA:  Left-sided chest and abdominal pain for 1 day EXAM: CHEST - 2 VIEW COMPARISON:  None. FINDINGS: The heart size and mediastinal contours are within normal limits. Both lungs are clear. The visualized skeletal structures are unremarkable. IMPRESSION: No active disease. Electronically Signed   By: Alcide Clever M.D.   On: 05/14/2015 12:43     ____________________________________________   PROCEDURES  Procedure(s) performed: None  Critical Care performed: No ____________________________________________   INITIAL IMPRESSION / ASSESSMENT AND PLAN / ED COURSE  Pertinent labs & imaging results that were available during my care of the patient were reviewed by me and considered in my medical decision making (see chart for details).  24 y.o. female, otherwise healthy, presenting with 2 days of left upper quadrant sharp pains that last for several seconds in the setting of a low fiber, high fat diet and hard stools with decreased frequency. The most likely etiology of her pain is gas pain or constipation. It is much is likely that she has reflux, gallbladder disease, or  obstruction. She has recently had cough and cold symptoms I will get a chest x-ray to rule out that her pain which is not reproducible on my exam is due to an early pneumonia. I will treat her symptomatically and plan discharge.  ----------------------------------------- 1:15 PM on 05/14/2015 -----------------------------------------  The patient has a reassuring examination, vital signs, normal labs studies, and a negative chest x-ray. I will plan to discharge her home and have her follow-up with her PMD. She understands return precautions as well as fall instructions.  ____________________________________________  FINAL CLINICAL IMPRESSION(S) / ED DIAGNOSES  Final diagnoses:  Left upper quadrant pain  Cough      NEW MEDICATIONS STARTED DURING THIS VISIT:  New Prescriptions   SENNA-DOCUSATE (SENOKOT-S) 8.6-50 MG TABLET    Take 2 tablets by mouth daily as needed for mild constipation.   SIMETHICONE (GAS-X) 80 MG CHEWABLE TABLET    Chew 1 tablet (80 mg total) by mouth 4 (four) times daily as needed for flatulence.     Rockne Menghini, MD 05/14/15 1315

## 2015-05-14 NOTE — ED Notes (Signed)
Pt here with c/o lower abd pain that began yesterday, with nausea but no vomiting, states it feels like really bad cramps, concerned for fibroids.

## 2015-05-14 NOTE — ED Notes (Signed)
Patient out of room at this time.

## 2016-07-14 ENCOUNTER — Encounter: Payer: Self-pay | Admitting: Certified Nurse Midwife

## 2016-07-31 ENCOUNTER — Ambulatory Visit: Payer: Commercial Managed Care - PPO | Admitting: Obstetrics & Gynecology

## 2016-09-14 ENCOUNTER — Encounter: Payer: Self-pay | Admitting: Physician Assistant

## 2016-09-14 ENCOUNTER — Ambulatory Visit: Payer: Self-pay | Admitting: Physician Assistant

## 2016-09-14 VITALS — BP 107/63 | HR 95 | Temp 99.2°F

## 2016-09-14 DIAGNOSIS — J020 Streptococcal pharyngitis: Secondary | ICD-10-CM

## 2016-09-14 DIAGNOSIS — J029 Acute pharyngitis, unspecified: Secondary | ICD-10-CM

## 2016-09-14 LAB — POCT RAPID STREP A (OFFICE): Rapid Strep A Screen: POSITIVE — AB

## 2016-09-14 MED ORDER — AMOXICILLIN 875 MG PO TABS
875.0000 mg | ORAL_TABLET | Freq: Two times a day (BID) | ORAL | 0 refills | Status: DC
Start: 2016-09-14 — End: 2017-08-22

## 2016-09-14 NOTE — Progress Notes (Signed)
S: c/o sore throat for 2 days, + low grade temp, states she has a long hx of strep, gets it several times a year, has seen ENT for same problem in the past, no cough/congestion/v/d  O: vitals wnl, nad, throat red swollen, neck supple tonsillar glands are swollen, lungs c t a, cv rrr, q strep +  A: strep throat  P: amoxil  bid x 10d, will refer to ENT due to freq infections

## 2016-09-14 NOTE — Progress Notes (Addendum)
Referral to [facility] ENT has been faxed per [facility] authorization.  Patients appointment with Dr. Willeen Cass  Is 09/20/16 @ 9:45.  I called the patient and left her a message.

## 2017-05-29 NOTE — L&D Delivery Note (Signed)
     Delivery Note   Allison [name] is a 27 y.o. G2P2002 at 6519w2d Estimated Date of Delivery: 04/22/18  PRE-OPERATIVE DIAGNOSIS:  1) 2219w2d pregnancy.    POST-OPERATIVE DIAGNOSIS:  1) 419w2d pregnancy s/p Vaginal, Spontaneous   Delivery Type: Vaginal, Spontaneous    Delivery Anesthesia: Epidural   Labor Complications:   precipitous delivery     ESTIMATED BLOOD LOSS: 250 ml    FINDINGS:   1) female infant, Apgar scores of 8    at 1 minute and 9    at 5 minutes and a birthweight of 116.4  ounces.    2) Nuchal cord: No  SPECIMENS:   PLACENTA:   Appearance: Intact , 3 vessel cord, cord blood sample collected   Removal: Spontaneous      Disposition:   held per protocol then discarded.   DISPOSITION:  Infant to left in stable condition in the delivery room, with L&D personnel and mother,  NARRATIVE SUMMARY: Labor course:  Ms. Allison CruelKourtney L Irizarry is a [id number] at 5819w2d who presented for labor management.  She progressed well in labor with pitocin.  She received the appropriate epidural anesthesia and proceeded to complete dilation. She evidenced good maternal expulsive effort during the second stage. She went on to deliver a viable female infant. The placenta delivered without problems and was noted to be complete. A perineal and vaginal examination was performed. Lacerations:   None. The patient tolerated this well.   Doreene Burkennie Marcine Gadway, CNM  04/24/2018 7:31 PM

## 2017-08-22 ENCOUNTER — Encounter: Payer: Self-pay | Admitting: Certified Nurse Midwife

## 2017-08-22 ENCOUNTER — Ambulatory Visit (INDEPENDENT_AMBULATORY_CARE_PROVIDER_SITE_OTHER): Payer: Managed Care, Other (non HMO)

## 2017-08-22 ENCOUNTER — Ambulatory Visit: Payer: Managed Care, Other (non HMO) | Admitting: Certified Nurse Midwife

## 2017-08-22 VITALS — BP 118/63 | HR 79 | Ht 67.0 in | Wt 176.0 lb

## 2017-08-22 DIAGNOSIS — N926 Irregular menstruation, unspecified: Secondary | ICD-10-CM | POA: Diagnosis not present

## 2017-08-22 LAB — POCT URINE PREGNANCY: Preg Test, Ur: POSITIVE — AB

## 2017-08-22 NOTE — Patient Instructions (Signed)
Prenatal Care WHAT IS PRENATAL CARE? Prenatal care is the process of caring for a pregnant woman before she gives birth. Prenatal care makes sure that she and her baby remain as healthy as possible throughout pregnancy. Prenatal care may be provided by a midwife, family practice health care provider, or a childbirth and pregnancy specialist (obstetrician). Prenatal care may include physical examinations, testing, treatments, and education on nutrition, lifestyle, and social support services. WHY IS PRENATAL CARE SO IMPORTANT? Early and consistent prenatal care increases the chance that you and your baby will remain healthy throughout your pregnancy. This type of care also decreases a baby's risk of being born too early (prematurely), or being born smaller than expected (small for gestational age). Any underlying medical conditions you may have that could pose a risk during your pregnancy are discussed during prenatal care visits. You will also be monitored regularly for any new conditions that may arise during your pregnancy so they can be treated quickly and effectively. WHAT HAPPENS DURING PRENATAL CARE VISITS? Prenatal care visits may include the following: Discussion Tell your health care provider about any new signs or symptoms you have experienced since your last visit. These might include:  Nausea or vomiting.  Increased or decreased level of energy.  Difficulty sleeping.  Back or leg pain.  Weight changes.  Frequent urination.  Shortness of breath with physical activity.  Changes in your skin, such as the development of a rash or itchiness.  Vaginal discharge or bleeding.  Feelings of excitement or nervousness.  Changes in your baby's movements.  You may want to write down any questions or topics you want to discuss with your health care provider and bring them with you to your appointment. Examination During your first prenatal care visit, you will likely have a complete  physical exam. Your health care provider will often examine your vagina, cervix, and the position of your uterus, as well as check your heart, lungs, and other body systems. As your pregnancy progresses, your health care provider will measure the size of your uterus and your baby's position inside your uterus. He or she may also examine you for early signs of labor. Your prenatal visits may also include checking your blood pressure and, after about 10-12 weeks of pregnancy, listening to your baby's heartbeat. Testing Regular testing often includes:  Urinalysis. This checks your urine for glucose, protein, or signs of infection.  Blood count. This checks the levels of white and red blood cells in your body.  Tests for sexually transmitted infections (STIs). Testing for STIs at the beginning of pregnancy is routinely done and is required in many states.  Antibody testing. You will be checked to see if you are immune to certain illnesses, such as rubella, that can affect a developing fetus.  Glucose screen. Around 24-28 weeks of pregnancy, your blood glucose level will be checked for signs of gestational diabetes. Follow-up tests may be recommended.  Group B strep. This is a bacteria that is commonly found inside a woman's vagina. This test will inform your health care provider if you need an antibiotic to reduce the amount of this bacteria in your body prior to labor and childbirth.  Ultrasound. Many pregnant women undergo an ultrasound screening around 18-20 weeks of pregnancy to evaluate the health of the fetus and check for any developmental abnormalities.  HIV (human immunodeficiency virus) testing. Early in your pregnancy, you will be screened for HIV. If you are at high risk for HIV, this test may   be repeated during your third trimester of pregnancy.  You may be offered other testing based on your age, personal or family medical history, or other factors. HOW OFTEN SHOULD I PLAN TO SEE MY  HEALTH CARE PROVIDER FOR PRENATAL CARE? Your prenatal care check-up schedule depends on any medical conditions you have before, or develop during, your pregnancy. If you do not have any underlying medical conditions, you will likely be seen for checkups:  Monthly, during the first 6 months of pregnancy.  Twice a month during months 7 and 8 of pregnancy.  Weekly starting in the 9th month of pregnancy and until delivery.  If you develop signs of early labor or other concerning signs or symptoms, you may need to see your health care provider more often. Ask your health care provider what prenatal care schedule is best for you. WHAT CAN I DO TO KEEP MYSELF AND MY BABY AS HEALTHY AS POSSIBLE DURING MY PREGNANCY?  Take a prenatal vitamin containing 400 micrograms (0.4 mg) of folic acid every day. Your health care provider may also ask you to take additional vitamins such as iodine, vitamin D, iron, copper, and zinc.  Take 1500-2000 mg of calcium daily starting at your 20th week of pregnancy until you deliver your baby.  Make sure you are up to date on your vaccinations. Unless directed otherwise by your health care provider: ? You should receive a tetanus, diphtheria, and pertussis (Tdap) vaccination between the 27th and 36th week of your pregnancy, regardless of when your last Tdap immunization occurred. This helps protect your baby from whooping cough (pertussis) after he or she is born. ? You should receive an annual inactivated influenza vaccine (IIV) to help protect you and your baby from influenza. This can be done at any point during your pregnancy.  Eat a well-rounded diet that includes: ? Fresh fruits and vegetables. ? Lean proteins. ? Calcium-rich foods such as milk, yogurt, hard cheeses, and dark, leafy greens. ? Whole grain breads.  Do noteat seafood high in mercury, including: ? Swordfish. ? Tilefish. ? Shark. ? King mackerel. ? More than 6 oz tuna per week.  Do not  eat: ? Raw or undercooked meats or eggs. ? Unpasteurized foods, such as soft cheeses (brie, blue, or feta), juices, and milks. ? Lunch meats. ? Hot dogs that have not been heated until they are steaming.  Drink enough water to keep your urine clear or pale yellow. For many women, this may be 10 or more 8 oz glasses of water each day. Keeping yourself hydrated helps deliver nutrients to your baby and may prevent the start of pre-term uterine contractions.  Do not use any tobacco products including cigarettes, chewing tobacco, or electronic cigarettes. If you need help quitting, ask your health care provider.  Do not drink beverages containing alcohol. No safe level of alcohol consumption during pregnancy has been determined.  Do not use any illegal drugs. These can harm your developing baby or cause a miscarriage.  Ask your health care provider or pharmacist before taking any prescription or over-the-counter medicines, herbs, or supplements.  Limit your caffeine intake to no more than 200 mg per day.  Exercise. Unless told otherwise by your health care provider, try to get 30 minutes of moderate exercise most days of the week. Do not  do high-impact activities, contact sports, or activities with a high risk of falling, such as horseback riding or downhill skiing.  Get plenty of rest.  Avoid anything that raises your  body temperature, such as hot tubs and saunas.  If you own a cat, do not empty its litter box. Bacteria contained in cat feces can cause an infection called toxoplasmosis. This can result in serious harm to the fetus.  Stay away from chemicals such as insecticides, lead, mercury, and cleaning or paint products that contain solvents.  Do not have any X-rays taken unless medically necessary.  Take a childbirth and breastfeeding preparation class. Ask your health care provider if you need a referral or recommendation.  This information is not intended to replace advice given  to you by your health care provider. Make sure you discuss any questions you have with your health care provider. Document Released: 05/18/2003 Document Revised: 10/18/2015 Document Reviewed: 07/30/2013 Elsevier Interactive Patient Education  2017 [employer] Eating Plan for Pregnant Women While you are pregnant, your body will require additional nutrition to help support your growing baby. It is recommended that you consume:  150 additional calories each day during your first trimester.  300 additional calories each day during your second trimester.  300 additional calories each day during your third trimester.  Eating a healthy, well-balanced diet is very important for your health and for your baby's health. You also have a higher need for some vitamins and minerals, such as folic acid, calcium, iron, and vitamin D. What do I need to know about eating during pregnancy?  Do not try to lose weight or go on a diet during pregnancy.  Choose healthy, nutritious foods. Choose  of a sandwich with a glass of milk instead of a candy bar or a high-calorie sugar-sweetened beverage.  Limit your overall intake of foods that have "empty calories." These are foods that have little nutritional value, such as sweets, desserts, candies, sugar-sweetened beverages, and fried foods.  Eat a variety of foods, especially fruits and vegetables.  Take a prenatal vitamin to help meet the additional needs during pregnancy, specifically for folic acid, iron, calcium, and vitamin D.  Remember to stay active. Ask your health care provider for exercise recommendations that are specific to you.  Practice good food safety and cleanliness, such as washing your hands before you eat and after you prepare raw meat. This helps to prevent foodborne illnesses, such as listeriosis, that can be very dangerous for your baby. Ask your health care provider for more information about listeriosis. What does 150 extra calories  look like? Healthy options for an additional 150 calories each day could be any of the following:  Plain low-fat yogurt (6-8 oz) with  cup of berries.  1 apple with 2 teaspoons of peanut butter.  Cut-up vegetables with  cup of hummus.  Low-fat chocolate milk (8 oz or 1 cup).  1 string cheese with 1 medium orange.   of a peanut butter and jelly sandwich on whole-wheat bread (1 tsp of peanut butter).  For 300 calories, you could eat two of those healthy options each day. What is a healthy amount of weight to gain? The recommended amount of weight for you to gain is based on your pre-pregnancy BMI. If your pre-pregnancy BMI was:  Less than 18 (underweight), you should gain 28-40 lb.  18-24.9 (normal), you should gain 25-35 lb.  25-29.9 (overweight), you should gain 15-25 lb.  Greater than 30 (obese), you should gain 11-20 lb.  What if I am having twins or multiples? Generally, pregnant women who will be having twins or multiples may need to increase their daily calories by 300-600 calories each   day. The recommended range for total weight gain is 25-54 lb, depending on your pre-pregnancy BMI. Talk with your health care provider for specific guidance about additional nutritional needs, weight gain, and exercise during your pregnancy. What foods can I eat? Grains Any grains. Try to choose whole grains, such as whole-wheat bread, oatmeal, or brown rice. Vegetables Any vegetables. Try to eat a variety of colors and types of vegetables to get a full range of vitamins and minerals. Remember to wash your vegetables well before eating. Fruits Any fruits. Try to eat a variety of colors and types of fruit to get a full range of vitamins and minerals. Remember to wash your fruits well before eating. Meats and Other Protein Sources Lean meats, including chicken, turkey, fish, and lean cuts of beef, veal, or pork. Make sure that all meats are cooked to "well done." Tofu. Tempeh. Beans. Eggs.  Peanut butter and other nut butters. Seafood, such as shrimp, crab, and lobster. If you choose fish, select types that are higher in omega-3 fatty acids, including salmon, herring, mussels, trout, sardines, and pollock. Make sure that all meats are cooked to food-safe temperatures. Dairy Pasteurized milk and milk alternatives. Pasteurized yogurt and pasteurized cheese. Cottage cheese. Sour cream. Beverages Water. Juices that contain 100% fruit juice or vegetable juice. Caffeine-free teas and decaffeinated coffee. Drinks that contain caffeine are okay to drink, but it is better to avoid caffeine. Keep your total caffeine intake to less than 200 mg each day (12 oz of coffee, tea, or soda) or as directed by your health care provider. Condiments Any pasteurized condiments. Sweets and Desserts Any sweets and desserts. Fats and Oils Any fats and oils. The items listed above may not be a complete list of recommended foods or beverages. Contact your dietitian for more options. What foods are not recommended? Vegetables Unpasteurized (raw) vegetable juices. Fruits Unpasteurized (raw) fruit juices. Meats and Other Protein Sources Cured meats that have nitrates, such as bacon, salami, and hotdogs. Luncheon meats, bologna, or other deli meats (unless they are reheated until they are steaming hot). Refrigerated pate, meat spreads from a meat counter, smoked seafood that is found in the refrigerated section of a store. Raw fish, such as sushi or sashimi. High mercury content fish, such as tilefish, shark, swordfish, and king mackerel. Raw meats, such as tuna or beef tartare. Undercooked meats and poultry. Make sure that all meats are cooked to food-safe temperatures. Dairy Unpasteurized (raw) milk and any foods that have raw milk in them. Soft cheeses, such as feta, queso blanco, queso fresco, Brie, Camembert cheeses, blue-veined cheeses, and Panela cheese (unless it is made with pasteurized milk, which must  be stated on the label). Beverages Alcohol. Sugar-sweetened beverages, such as sodas, teas, or energy drinks. Condiments Homemade fermented foods and drinks, such as pickles, sauerkraut, or kombucha drinks. (Store-bought pasteurized versions of these are okay.) Other Salads that are made in the store, such as ham salad, chicken salad, egg salad, tuna salad, and seafood salad. The items listed above may not be a complete list of foods and beverages to avoid. Contact your dietitian for more information. This information is not intended to replace advice given to you by your health care provider. Make sure you discuss any questions you have with your health care provider. Document Released: 02/27/2014 Document Revised: 10/21/2015 Document Reviewed: 10/28/2013 Elsevier Interactive Patient Education  2018 [employer] Inc[employer]  

## 2017-08-22 NOTE — Progress Notes (Signed)
Pt is here for a confirmation of pregnancy. 

## 2017-08-22 NOTE — Progress Notes (Signed)
Subjective:    Allison [name] is a 27 y.o. female who presents for evaluation of amenorrhea. She believes she could be pregnant. Pregnancy is desired. Sexual Activity: single partner, contraception: none. Current symptoms also include: breast tenderness, fatigue and nausea. Last period was normal.   Patient's last menstrual period was 07/08/2017 (approximate). The following portions of the patient's history were reviewed and updated as appropriate: allergies, current medications, past family history, past medical history, past social history, past surgical history and problem list.  Review of Systems Pertinent items are noted in HPI.     Objective:    BP 118/63   Pulse 79   Ht 5\' 7"  (1.702 m)   Wt 176 lb (79.8 kg)   LMP 07/08/2017 (Approximate)   Breastfeeding? No   BMI 27.57 kg/m  General: alert, cooperative, appears stated age, no distress and no acute distress    Lab Review Urine HCG: positive    Assessment:    Absence of menstruation.     Plan:    Pregnancy Test: Positive: EDC: 11/ 17/19  ( unsure of exact date of LMP) Briefly discussed pre-natal care options.  Encouraged well-balanced diet, plenty of rest when needed, pre-natal vitamins daily and walking for exercise. Discussed self-help for nausea, avoiding OTC medications until consulting provider or pharmacist, other than Tylenol as needed, minimal caffeine (1-2 cups daily) and avoiding alcohol. Dating /viability u/s as soon as possible.  She will schedule her initial nurse OB visit in 10wks and the physical exam @12  wks.   Allison BurkeAnnie Trenace [name], CNM

## 2017-08-22 NOTE — Addendum Note (Signed)
Addended by: Rosine BeatLONTZ, Tu Shimmel L on: 08/22/2017 02:15 PM   Modules accepted: Orders

## 2017-08-23 LAB — BETA HCG QUANT (REF LAB): HCG QUANT: 9014 m[IU]/mL

## 2017-08-27 ENCOUNTER — Encounter: Payer: Self-pay | Admitting: Certified Nurse Midwife

## 2017-09-02 ENCOUNTER — Emergency Department: Payer: Managed Care, Other (non HMO)

## 2017-09-02 ENCOUNTER — Encounter: Payer: Self-pay | Admitting: *Deleted

## 2017-09-02 ENCOUNTER — Encounter: Payer: Self-pay | Admitting: Certified Nurse Midwife

## 2017-09-02 ENCOUNTER — Other Ambulatory Visit: Payer: Self-pay

## 2017-09-02 ENCOUNTER — Emergency Department
Admission: EM | Admit: 2017-09-02 | Discharge: 2017-09-02 | Disposition: A | Discharge status: Home / Self Care | Payer: Managed Care, Other (non HMO) | Attending: Emergency Medicine | Admitting: Emergency Medicine

## 2017-09-02 DIAGNOSIS — O418X10 Other specified disorders of amniotic fluid and membranes, first trimester, not applicable or unspecified: Secondary | ICD-10-CM | POA: Diagnosis not present

## 2017-09-02 DIAGNOSIS — Z3A01 Less than 8 weeks gestation of pregnancy: Secondary | ICD-10-CM | POA: Insufficient documentation

## 2017-09-02 DIAGNOSIS — O468X1 Other antepartum hemorrhage, first trimester: Secondary | ICD-10-CM

## 2017-09-02 DIAGNOSIS — O209 Hemorrhage in early pregnancy, unspecified: Secondary | ICD-10-CM | POA: Diagnosis present

## 2017-09-02 DIAGNOSIS — J45909 Unspecified asthma, uncomplicated: Secondary | ICD-10-CM | POA: Insufficient documentation

## 2017-09-02 DIAGNOSIS — O200 Threatened abortion: Secondary | ICD-10-CM | POA: Diagnosis not present

## 2017-09-02 LAB — HCG, QUANTITATIVE, PREGNANCY: HCG, BETA CHAIN, QUANT, S: 104077 m[IU]/mL — AB (ref <5)

## 2017-09-02 NOTE — ED Provider Notes (Signed)
[facility] Emergency Department Provider Note   ____________________________________________   I have reviewed the triage vital signs and the nursing notes.   HISTORY  Chief Complaint Vaginal Bleeding   History limited by: Not Limited   HPI Allison [name] is a 27 y.o. female who presents to the emergency department today because of concern for vaginal bleeding in the setting of early pregnancy.  Patient states that the bleeding started today.  It initially started with a gush of blood.  She has since had a couple of episodes of bleeding.  The patient has some mild discomfort in the lower abdomen but she states nothing severe.  She states the pregnancy has been going okay.  She is taking prenatal vitamins.  Not to significant nausea or vomiting.  She had an ultrasound performed recently which showed potentially twin sacs although was told that 1 of the sacs might be blood.   Per medical record review patient has a history of O Pos.  Past Medical History:  Diagnosis Date  . Asthma    exercise induced, inhaler use 8 years ago    There are no active problems to display for this patient.   Past Surgical History:  Procedure Laterality Date  . WISDOM TOOTH EXTRACTION      Prior to Admission medications   Medication Sig Start Date End Date Taking? Authorizing Provider  Prenatal Vit-Fe Fumarate-FA (PRENATAL MULTIVITAMIN) TABS tablet Take 1 tablet by mouth daily at 12 noon.    [provider]  simethicone (GAS-X) 80 MG chewable tablet Chew 1 tablet (80 mg total) by mouth 4 (four) times daily as needed for flatulence. 05/14/15 05/13/16  Rockne MenghiniNorman, Anne-Caroline, MD    Allergies Dust mite extract and Tree extract  No family history on file.  Social History Social History   Tobacco Use  . Smoking status: Never Smoker  . Smokeless tobacco: Never Used  Substance Use Topics  . Alcohol use: No  . Drug use: No    Review of  Systems Constitutional: No fever/chills Eyes: No visual changes. ENT: No sore throat. Cardiovascular: Denies chest pain. Respiratory: Denies shortness of breath. Gastrointestinal: Positive for lower abdominal discomfort.  Genitourinary: Positive for vaginal bleeding. Musculoskeletal: Negative for back pain. Skin: Negative for rash. Neurological: Negative for headaches, focal weakness or numbness.  ____________________________________________   PHYSICAL EXAM:  VITAL SIGNS: ED Triage Vitals  Enc Vitals Group     BP 09/02/17 1417 113/77     Pulse Rate 09/02/17 1417 97     Resp 09/02/17 1417 20     Temp 09/02/17 1417 98.8 F (37.1 C)     Temp Source 09/02/17 1417 Oral     SpO2 09/02/17 1417 100 %     Weight 09/02/17 1417 176 lb (79.8 kg)     Height 09/02/17 1417 5\' 7"  (1.702 m)     Head Circumference --      Peak Flow --      Pain Score 09/02/17 1425 0   Constitutional: Alert and oriented. Well appearing and in no distress. Eyes: Conjunctivae are normal.  ENT   Head: Normocephalic and atraumatic.   Nose: No congestion/rhinnorhea.   Mouth/Throat: Mucous membranes are moist.   Neck: No stridor. Hematological/Lymphatic/Immunilogical: No cervical lymphadenopathy. Cardiovascular: Normal rate, regular rhythm.  No murmurs, rubs, or gallops.  Respiratory: Normal respiratory effort without tachypnea nor retractions. Breath sounds are clear and equal bilaterally. No wheezes/rales/rhonchi. Gastrointestinal: Soft and non tender. No rebound. No guarding.  Genitourinary: Deferred  Musculoskeletal: Normal range of motion in all extremities. No lower extremity edema. Neurologic:  Normal speech and language. No gross focal neurologic deficits are appreciated.  Skin:  Skin is warm, dry and intact. No rash noted. Psychiatric: Mood and affect are normal. Speech and behavior are normal. Patient exhibits appropriate insight and  judgment.  ____________________________________________    LABS (pertinent positives/negatives)  bhcg 104,077  ____________________________________________   EKG  None  ____________________________________________    RADIOLOGY  Transvaginal US Single live IUP. Subchorionic hemorrhage.  ____________________________________________   PROCEDURES  Procedures  ____________________________________________   INITIAL IMPRESSION / ASSESSMENT AND PLAN / ED COURSE  Pertinent labs & imaging results that were available during my care of the patient were reviewed by me and considered in my medical decision making (see chart for details).  Patient presented to the emergency department today because of concerns for bleeding in early stages of pregnancy.  Differential would include miscarriage, threatened miscarriage, subchorionic hemorrhage amongst other etiologies.  Ultrasound did show one single live IUP with subchorionic hemorrhage.  Do think this would explain the patient's bleeding.  Discussed this extensively with the patient.  Also discussed pelvic rest with the patient.  The patient had a beta hCG checked which had elevated from previous check.  Previously documented O+ blood. Discussed with patient importance of ob/gyn follow up.   ____________________________________________   FINAL CLINICAL IMPRESSION(S) / ED DIAGNOSES  Final diagnoses:  Threatened miscarriage  Subchorionic hematoma in first trimester, single or unspecified fetus     Note: This dictation was prepared with Dragon dictation. Any transcriptional errors that result from this process are unintentional     Phineas Semen, MD 09/02/17 [phone]

## 2017-09-02 NOTE — ED Triage Notes (Addendum)
Patient states she is 8-weeks pregnant and began having vaginal bleeding this morning. Patient reports light, lower abdominal cramping. Patient states bleeding has been intermittent "gushes." Patient states she had an ultrasound on 3/27 that showed two sacs and has another scheduled for tomorrow.

## 2017-09-02 NOTE — ED Notes (Signed)
ED Provider at bedside. 

## 2017-09-02 NOTE — Discharge Instructions (Addendum)
Please seek medical attention for any high fevers, chest pain, shortness of breath, change in behavior, persistent vomiting, bloody stool or any other new or concerning symptoms.  

## 2017-09-03 ENCOUNTER — Ambulatory Visit (INDEPENDENT_AMBULATORY_CARE_PROVIDER_SITE_OTHER): Payer: Managed Care, Other (non HMO)

## 2017-09-03 DIAGNOSIS — N926 Irregular menstruation, unspecified: Secondary | ICD-10-CM | POA: Diagnosis not present

## 2017-09-04 ENCOUNTER — Other Ambulatory Visit: Payer: Managed Care, Other (non HMO)

## 2017-09-12 ENCOUNTER — Telehealth: Payer: Self-pay | Admitting: Certified Nurse Midwife

## 2017-09-12 NOTE — Telephone Encounter (Signed)
She can come in and have a repeat u/s today if it is available.   Thanks

## 2017-09-12 NOTE — Telephone Encounter (Signed)
Patient called stating she is bleeding again. She stated at last ultrasound she had 2 sacs one of which was just blood. She would like a call back. Thanks

## 2017-09-21 ENCOUNTER — Ambulatory Visit (INDEPENDENT_AMBULATORY_CARE_PROVIDER_SITE_OTHER): Payer: Managed Care, Other (non HMO) | Admitting: Certified Nurse Midwife

## 2017-09-21 VITALS — BP 87/58 | HR 109 | Ht 67.0 in | Wt 173.5 lb

## 2017-09-21 DIAGNOSIS — O219 Vomiting of pregnancy, unspecified: Secondary | ICD-10-CM

## 2017-09-21 DIAGNOSIS — Z202 Contact with and (suspected) exposure to infections with a predominantly sexual mode of transmission: Secondary | ICD-10-CM

## 2017-09-21 DIAGNOSIS — Z3481 Encounter for supervision of other normal pregnancy, first trimester: Secondary | ICD-10-CM

## 2017-09-21 LAB — OB RESULTS CONSOLE VARICELLA ZOSTER ANTIBODY, IGG: VARICELLA IGG: IMMUNE

## 2017-09-21 MED ORDER — DOXYLAMINE-PYRIDOXINE 10-10 MG PO TBEC: 10.0000 mg | DELAYED_RELEASE_TABLET | Freq: Every day | ORAL | 1 refills | Status: DC

## 2017-09-21 NOTE — Progress Notes (Signed)
Allison CruelKourtney L [name] presents for NOB nurse interview visit. Pregnancy confirmation done _3/27/2019 with AT_.  G-2 .  P1001-.  Dating scan on 09/03/2017 shows SIUP at 7 weeks with EDD of 04/22/2018.  Pregnancy education material explained and given. _0__ cats in the home. NOB labs ordered. Sickle cell ordered.  HIV labs and Drug screen were explained and ordered. PNV encouraged. Genetic screening options discussed. Genetic testing: Declined.  Pt may discuss with provider. Pt c/o of nausea. Will erx diclegis. Last pap 2018 wnl- h/o abn x 1. FMLA form - signed.  Pt. To follow up with [facility] in _3_ weeks for NOB physical.  All questions answered.

## 2017-09-21 NOTE — Patient Instructions (Signed)
WHAT OB PATIENTS CAN EXPECT   Confirmation of pregnancy and ultrasound ordered if medically indicated-[redacted] weeks gestation  New OB (NOB) intake with nurse and New OB (NOB) labs- [redacted] weeks gestation  New OB (NOB) physical examination with provider- 11/[redacted] weeks gestation  Flu vaccine-[redacted] weeks gestation  Anatomy scan-[redacted] weeks gestation  Glucose tolerance test, blood work to test for anemia, T-dap vaccine-[redacted] weeks gestation  Vaginal swabs/cultures-STD/Group B strep-[redacted] weeks gestation  Appointments every 4 weeks until 28 weeks  Every 2 weeks from 28 weeks until 36 weeks  Weekly visits from 36 weeks until delivery  Morning Sickness Morning sickness is when you feel sick to your stomach (nauseous) during pregnancy. You may feel sick to your stomach and throw up (vomit). You may feel sick in the morning, but you can feel this way any time of day. Some women feel very sick to their stomach and cannot stop throwing up (hyperemesis gravidarum). Follow these instructions at home:  Only take medicines as told by your doctor.  Take multivitamins as told by your doctor. Taking multivitamins before getting pregnant can stop or lessen the harshness of morning sickness.  Eat dry toast or unsalted crackers before getting out of bed.  Eat 5 to 6 small meals a day.  Eat dry and bland foods like rice and baked potatoes.  Do not drink liquids with meals. Drink between meals.  Do not eat greasy, fatty, or spicy foods.  Have someone cook for you if the smell of food causes you to feel sick or throw up.  If you feel sick to your stomach after taking prenatal vitamins, take them at night or with a snack.  Eat protein when you need a snack (nuts, yogurt, cheese).  Eat unsweetened gelatins for dessert.  Wear a bracelet used for sea sickness (acupressure wristband).  Go to a doctor that puts thin needles into certain body points (acupuncture) to improve how you feel.  Do not smoke.  Use a  humidifier to keep the air in your house free of odors.  Get lots of fresh air. Contact a doctor if:  You need medicine to feel better.  You feel dizzy or lightheaded.  You are losing weight. Get help right away if:  You feel very sick to your stomach and cannot stop throwing up.  You pass out (faint). This information is not intended to replace advice given to you by your health care provider. Make sure you discuss any questions you have with your health care provider. Document Released: 06/22/2004 Document Revised: 10/21/2015 Document Reviewed: 10/30/2012 Elsevier Interactive Patient Education  2017 [employer]. How a Baby Grows During Pregnancy Pregnancy begins when a female's sperm enters a female's egg (fertilization). This happens in one of the tubes (fallopian tubes) that connect the ovaries to the womb (uterus). The fertilized egg is called an embryo until it reaches 10 weeks. From 10 weeks until birth, it is called a fetus. The fertilized egg moves down the fallopian tube to the uterus. Then it implants into the lining of the uterus and begins to grow. The developing fetus receives oxygen and nutrients through the pregnant woman's bloodstream and the tissues that grow (placenta) to support the fetus. The placenta is the life support system for the fetus. It provides nutrition and removes waste. Learning as much as you can about your pregnancy and how your baby is developing can help you enjoy the experience. It can also make you aware of when there might be a problem  and when to ask questions. How long does a typical pregnancy last? A pregnancy usually lasts 280 days, or about 40 weeks. Pregnancy is divided into three trimesters:  First trimester: 0-13 weeks.  Second trimester: 14-27 weeks.  Third trimester: 28-40 weeks.  The day when your baby is considered ready to be born (full term) is your estimated date of delivery. How does my baby develop month by month? First  month  The fertilized egg attaches to the inside of the uterus.  Some cells will form the placenta. Others will form the fetus.  The arms, legs, brain, spinal cord, lungs, and heart begin to develop.  At the end of the first month, the heart begins to beat.  Second month  The bones, inner ear, eyelids, hands, and feet form.  The genitals develop.  By the end of 8 weeks, all major organs are developing.  Third month  All of the internal organs are forming.  Teeth develop below the gums.  Bones and muscles begin to grow. The spine can flex.  The skin is transparent.  Fingernails and toenails begin to form.  Arms and legs continue to grow longer, and hands and feet develop.  The fetus is about 3 in (7.6 cm) long.  Fourth month  The placenta is completely formed.  The external sex organs, neck, outer ear, eyebrows, eyelids, and fingernails are formed.  The fetus can hear, swallow, and move its arms and legs.  The kidneys begin to produce urine.  The skin is covered with a white waxy coating (vernix) and very fine hair (lanugo).  Fifth month  The fetus moves around more and can be felt for the first time (quickening).  The fetus starts to sleep and wake up and may begin to suck its finger.  The nails grow to the end of the fingers.  The organ in the digestive system that makes bile (gallbladder) functions and helps to digest the nutrients.  If your baby is a girl, eggs are present in her ovaries. If your baby is a boy, testicles start to move down into his scrotum.  Sixth month  The lungs are formed, but the fetus is not yet able to breathe.  The eyes open. The brain continues to develop.  Your baby has fingerprints and toe prints. Your baby's hair grows thicker.  At the end of the second trimester, the fetus is about 9 in (22.9 cm) long.  Seventh month  The fetus kicks and stretches.  The eyes are developed enough to sense changes in light.  The  hands can make a grasping motion.  The fetus responds to sound.  Eighth month  All organs and body systems are fully developed and functioning.  Bones harden and taste buds develop. The fetus may hiccup.  Certain areas of the brain are still developing. The skull remains soft.  Ninth month  The fetus gains about  lb (0.23 kg) each week.  The lungs are fully developed.  Patterns of sleep develop.  The fetus's head typically moves into a head-down position (vertex) in the uterus to prepare for birth. If the buttocks move into a vertex position instead, the baby is breech.  The fetus weighs 6-9 lbs (2.72-4.08 kg) and is 19-20 in (48.26-50.8 cm) long.  What can I do to have a healthy pregnancy and help my baby develop? Eating and Drinking  Eat a healthy diet. ? Talk with your health care provider to make sure that you are getting the  nutrients that you and your baby need. ? Visit www.BuildDNA.es to learn about creating a healthy diet.  Gain a healthy amount of weight during pregnancy as advised by your health care provider. This is usually 25-35 pounds. You may need to: ? Gain more if you were underweight before getting pregnant or if you are pregnant with more than one baby. ? Gain less if you were overweight or obese when you got pregnant.  Medicines and Vitamins  Take prenatal vitamins as directed by your health care provider. These include vitamins such as folic acid, iron, calcium, and vitamin D. They are important for healthy development.  Take medicines only as directed by your health care provider. Read labels and ask a pharmacist or your health care provider whether over-the-counter medicines, supplements, and prescription drugs are safe to take during pregnancy.  Activities  Be physically active as advised by your health care provider. Ask your health care provider to recommend activities that are safe for you to do, such as walking or swimming.  Do not  participate in strenuous or extreme sports.  Lifestyle  Do not drink alcohol.  Do not use any tobacco products, including cigarettes, chewing tobacco, or electronic cigarettes. If you need help quitting, ask your health care provider.  Do not use illegal drugs.  Safety  Avoid exposure to mercury, lead, or other heavy metals. Ask your health care provider about common sources of these heavy metals.  Avoid listeria infection during pregnancy. Follow these precautions: ? Do not eat soft cheeses or deli meats. ? Do not eat hot dogs unless they have been warmed up to the point of steaming, such as in the microwave oven. ? Do not drink unpasteurized milk.  Avoid toxoplasmosis infection during pregnancy. Follow these precautions: ? Do not change your cat's litter box, if you have a cat. Ask someone else to do this for you. ? Wear gardening gloves while working in the yard.  General Instructions  Keep all follow-up visits as directed by your health care provider. This is important. This includes prenatal care and screening tests.  Manage any chronic health conditions. Work closely with your health care provider to keep conditions, such as diabetes, under control.  How do I know if my baby is developing well? At each prenatal visit, your health care provider will do several different tests to check on your health and keep track of your baby's development. These include:  Fundal height. ? Your health care provider will measure your growing belly from top to bottom using a tape measure. ? Your health care provider will also feel your belly to determine your baby's position.  Heartbeat. ? An ultrasound in the first trimester can confirm pregnancy and show a heartbeat, depending on how far along you are. ? Your health care provider will check your baby's heart rate at every prenatal visit. ? As you get closer to your delivery date, you may have regular fetal heart rate monitoring to make  sure that your baby is not in distress.  Second trimester ultrasound. ? This ultrasound checks your baby's development. It also indicates your baby's gender.  What should I do if I have concerns about my baby's development? Always talk with your health care provider about any concerns that you may have. This information is not intended to replace advice given to you by your health care provider. Make sure you discuss any questions you have with your health care provider. Document Released: 11/01/2007 Document Revised: 10/21/2015 Document Reviewed:  10/22/2013 Elsevier Interactive Patient Education  2018 [place] Trimester of Pregnancy The first trimester of pregnancy is from week 1 until the end of week 13 (months 1 through 3). A week after a sperm fertilizes an egg, the egg will implant on the wall of the uterus. This embryo will begin to develop into a baby. Genes from you and your partner will form the baby. The female genes will determine whether the baby will be a boy or a girl. At 6-8 weeks, the eyes and face will be formed, and the heartbeat can be seen on ultrasound. At the end of 12 weeks, all the baby's organs will be formed. Now that you are pregnant, you will want to do everything you can to have a healthy baby. Two of the most important things are to get good prenatal care and to follow your health care provider's instructions. Prenatal care is all the medical care you receive before the baby's birth. This care will help prevent, find, and treat any problems during the pregnancy and childbirth. Body changes during your first trimester Your body goes through many changes during pregnancy. The changes vary from woman to woman.  You may gain or lose a couple of pounds at first.  You may feel sick to your stomach (nauseous) and you may throw up (vomit). If the vomiting is uncontrollable, call your health care provider.  You may tire easily.  You may develop headaches that can  be relieved by medicines. All medicines should be approved by your health care provider.  You may urinate more often. Painful urination may mean you have a bladder infection.  You may develop heartburn as a result of your pregnancy.  You may develop constipation because certain hormones are causing the muscles that push stool through your intestines to slow down.  You may develop hemorrhoids or swollen veins (varicose veins).  Your breasts may begin to grow larger and become tender. Your nipples may stick out more, and the tissue that surrounds them (areola) may become darker.  Your gums may bleed and may be sensitive to brushing and flossing.  Dark spots or blotches (chloasma, mask of pregnancy) may develop on your face. This will likely fade after the baby is born.  Your menstrual periods will stop.  You may have a loss of appetite.  You may develop cravings for certain kinds of food.  You may have changes in your emotions from day to day, such as being excited to be pregnant or being concerned that something may go wrong with the pregnancy and baby.  You may have more vivid and strange dreams.  You may have changes in your hair. These can include thickening of your hair, rapid growth, and changes in texture. Some women also have hair loss during or after pregnancy, or hair that feels dry or thin. Your hair will most likely return to normal after your baby is born.  What to expect at prenatal visits During a routine prenatal visit:  You will be weighed to make sure you and the baby are growing normally.  Your blood pressure will be taken.  Your abdomen will be measured to track your baby's growth.  The fetal heartbeat will be listened to between weeks 10 and 14 of your pregnancy.  Test results from any previous visits will be discussed.  Your health care provider may ask you:  How you are feeling.  If you are feeling the baby move.  If you have had any  abnormal  symptoms, such as leaking fluid, bleeding, severe headaches, or abdominal cramping.  If you are using any tobacco products, including cigarettes, chewing tobacco, and electronic cigarettes.  If you have any questions.  Other tests that may be performed during your first trimester include:  Blood tests to find your blood type and to check for the presence of any previous infections. The tests will also be used to check for low iron levels (anemia) and protein on red blood cells (Rh antibodies). Depending on your risk factors, or if you previously had diabetes during pregnancy, you may have tests to check for high blood sugar that affects pregnant women (gestational diabetes).  Urine tests to check for infections, diabetes, or protein in the urine.  An ultrasound to confirm the proper growth and development of the baby.  Fetal screens for spinal cord problems (spina bifida) and Down syndrome.  HIV (human immunodeficiency virus) testing. Routine prenatal testing includes screening for HIV, unless you choose not to have this test.  You may need other tests to make sure you and the baby are doing well.  Follow these instructions at home: Medicines  Follow your health care provider's instructions regarding medicine use. Specific medicines may be either safe or unsafe to take during pregnancy.  Take a prenatal vitamin that contains at least 600 micrograms (mcg) of folic acid.  If you develop constipation, try taking a stool softener if your health care provider approves. Eating and drinking  Eat a balanced diet that includes fresh fruits and vegetables, whole grains, good sources of protein such as meat, eggs, or tofu, and low-fat dairy. Your health care provider will help you determine the amount of weight gain that is right for you.  Avoid raw meat and uncooked cheese. These carry germs that can cause birth defects in the baby.  Eating four or five small meals rather than three large  meals a day may help relieve nausea and vomiting. If you start to feel nauseous, eating a few soda crackers can be helpful. Drinking liquids between meals, instead of during meals, also seems to help ease nausea and vomiting.  Limit foods that are high in fat and processed sugars, such as fried and sweet foods.  To prevent constipation: ? Eat foods that are high in fiber, such as fresh fruits and vegetables, whole grains, and beans. ? Drink enough fluid to keep your urine clear or pale yellow. Activity  Exercise only as directed by your health care provider. Most women can continue their usual exercise routine during pregnancy. Try to exercise for 30 minutes at least 5 days a week. Exercising will help you: ? Control your weight. ? Stay in shape. ? Be prepared for labor and delivery.  Experiencing pain or cramping in the lower abdomen or lower back is a good sign that you should stop exercising. Check with your health care provider before continuing with normal exercises.  Try to avoid standing for long periods of time. Move your legs often if you must stand in one place for a long time.  Avoid heavy lifting.  Wear low-heeled shoes and practice good posture.  You may continue to have sex unless your health care provider tells you not to. Relieving pain and discomfort  Wear a good support bra to relieve breast tenderness.  Take warm sitz baths to soothe any pain or discomfort caused by hemorrhoids. Use hemorrhoid cream if your health care provider approves.  Rest with your legs elevated if you have leg   cramps or low back pain.  If you develop varicose veins in your legs, wear support hose. Elevate your feet for 15 minutes, 3-4 times a day. Limit salt in your diet. Prenatal care  Schedule your prenatal visits by the twelfth week of pregnancy. They are usually scheduled monthly at first, then more often in the last 2 months before delivery.  Write down your questions. Take them to  your prenatal visits.  Keep all your prenatal visits as told by your health care provider. This is important. Safety  Wear your seat belt at all times when driving.  Make a list of emergency phone numbers, including numbers for family, friends, the hospital, and police and fire departments. General instructions  Ask your health care provider for a referral to a local prenatal education class. Begin classes no later than the beginning of month 6 of your pregnancy.  Ask for help if you have counseling or nutritional needs during pregnancy. Your health care provider can offer advice or refer you to specialists for help with various needs.  Do not use hot tubs, steam rooms, or saunas.  Do not douche or use tampons or scented sanitary pads.  Do not cross your legs for long periods of time.  Avoid cat litter boxes and soil used by cats. These carry germs that can cause birth defects in the baby and possibly loss of the fetus by miscarriage or stillbirth.  Avoid all smoking, herbs, alcohol, and medicines not prescribed by your health care provider. Chemicals in these products affect the formation and growth of the baby.  Do not use any products that contain nicotine or tobacco, such as cigarettes and e-cigarettes. If you need help quitting, ask your health care provider. You may receive counseling support and other resources to help you quit.  Schedule a dentist appointment. At home, brush your teeth with a soft toothbrush and be gentle when you floss. Contact a health care provider if:  You have dizziness.  You have mild pelvic cramps, pelvic pressure, or nagging pain in the abdominal area.  You have persistent nausea, vomiting, or diarrhea.  You have a bad smelling vaginal discharge.  You have pain when you urinate.  You notice increased swelling in your face, hands, legs, or ankles.  You are exposed to fifth disease or chickenpox.  You are exposed to German measles (rubella)  and have never had it. Get help right away if:  You have a fever.  You are leaking fluid from your vagina.  You have spotting or bleeding from your vagina.  You have severe abdominal cramping or pain.  You have rapid weight gain or loss.  You vomit blood or material that looks like coffee grounds.  You develop a severe headache.  You have shortness of breath.  You have any kind of trauma, such as from a fall or a car accident. Summary  The first trimester of pregnancy is from week 1 until the end of week 13 (months 1 through 3).  Your body goes through many changes during pregnancy. The changes vary from woman to woman.  You will have routine prenatal visits. During those visits, your health care provider will examine you, discuss any test results you may have, and talk with you about how you are feeling. This information is not intended to replace advice given to you by your health care provider. Make sure you discuss any questions you have with your health care provider. Document Released: 05/09/2001 Document Revised: 04/26/2016 Document   Reviewed: 04/26/2016 Elsevier Interactive Patient Education  2018 [employer]. Commonly Asked Questions During Pregnancy  Cats: A parasite can be excreted in cat feces.  To avoid exposure you need to have another person empty the little box.  If you must empty the litter box you will need to wear gloves.  Wash your hands after handling your cat.  This parasite can also be found in raw or undercooked meat so this should also be avoided.  Colds, Sore Throats, Flu: Please check your medication sheet to see what you can take for symptoms.  If your symptoms are unrelieved by these medications please call the office.  Dental Work: Most any dental work [employer] recommends is permitted.  X-rays should only be taken during the first trimester if absolutely necessary.  Your abdomen should be shielded with a lead apron during all x-rays.  Please  notify your provider prior to receiving any x-rays.  Novocaine is fine; gas is not recommended.  If your dentist requires a note from [place] prior to dental work please call the office and we will provide one for you.  Exercise: Exercise is an important part of staying healthy during your pregnancy.  You may continue most exercises you were accustomed to prior to pregnancy.  Later in your pregnancy you will most likely notice you have difficulty with activities requiring balance like riding a bicycle.  It is important that you listen to your body and avoid activities that put you at a higher risk of falling.  Adequate rest and staying well hydrated are a must!  If you have questions about the safety of specific activities ask your provider.    Exposure to Children with illness: Try to avoid obvious exposure; report any symptoms to [place] when noted,  If you have chicken pos, red measles or mumps, you should be immune to these diseases.   Please do not take any vaccines while pregnant unless you have checked with your OB provider.  Fetal Movement: After 28 weeks we recommend you do "kick counts" twice daily.  Lie or sit down in a calm quiet environment and count your baby movements "kicks".  You should feel your baby at least 10 times per hour.  If you have not felt 10 kicks within the first hour get up, walk around and have something sweet to eat or drink then repeat for an additional hour.  If count remains less than 10 per hour notify your provider.  Fumigating: Follow your pest control agent's advice as to how long to stay out of your home.  Ventilate the area well before re-entering.  Hemorrhoids:   Most over-the-counter preparations can be used during pregnancy.  Check your medication to see what is safe to use.  It is important to use a stool softener or fiber in your diet and to drink lots of liquids.  If hemorrhoids seem to be getting worse please call the office.   Hot Tubs:  Hot tubs Jacuzzis and  saunas are not recommended while pregnant.  These increase your internal body temperature and should be avoided.  Intercourse:  Sexual intercourse is safe during pregnancy as long as you are comfortable, unless otherwise advised by your provider.  Spotting may occur after intercourse; report any bright red bleeding that is heavier than spotting.  Labor:  If you know that you are in labor, please go to the hospital.  If you are unsure, please call the office and let us help you decide what to  do.  Lifting, straining, etc:  If your job requires heavy lifting or straining please check with your provider for any limitations.  Generally, you should not lift items heavier than that you can lift simply with your hands and arms (no back muscles)  Painting:  Paint fumes do not harm your pregnancy, but may make you ill and should be avoided if possible.  Latex or water based paints have less odor than oils.  Use adequate ventilation while painting.  Permanents & Hair Color:  Chemicals in hair dyes are not recommended as they cause increase hair dryness which can increase hair loss during pregnancy.  " Highlighting" and permanents are allowed.  Dye may be absorbed differently and permanents may not hold as well during pregnancy.  Sunbathing:  Use a sunscreen, as skin burns easily during pregnancy.  Drink plenty of fluids; avoid over heating.  Tanning Beds:  Because their possible side effects are still unknown, tanning beds are not recommended.  Ultrasound Scans:  Routine ultrasounds are performed at approximately 20 weeks.  You will be able to see your baby's general anatomy an if you would like to know the gender this can usually be determined as well.  If it is questionable when you conceived you may also receive an ultrasound early in your pregnancy for dating purposes.  Otherwise ultrasound exams are not routinely performed unless there is a medical necessity.  Although you can request a scan we ask that  you pay for it when conducted because insurance does not cover " patient request" scans.  Work: If your pregnancy proceeds without complications you may work until your due date, unless your physician or employer advises otherwise.  Round Ligament Pain/Pelvic Discomfort:  Sharp, shooting pains not associated with bleeding are fairly common, usually occurring in the second trimester of pregnancy.  They tend to be worse when standing up or when you remain standing for long periods of time.  These are the result of pressure of certain pelvic ligaments called "round ligaments".  Rest, Tylenol and heat seem to be the most effective relief.  As the womb and fetus grow, they rise out of the pelvis and the discomfort improves.  Please notify the office if your pain seems different than that described.  It may represent a more serious condition.  Common Medications Safe in Pregnancy  Acne:      Constipation:  Benzoyl Peroxide     Colace  Clindamycin      Dulcolax Suppository  Topica Erythromycin     Fibercon  Salicylic Acid      Metamucil         Miralax AVOID:        Senakot   Accutane    Cough:  Retin-A       Cough Drops  Tetracycline      Phenergan w/ Codeine if Rx  Minocycline      Robitussin (Plain & DM)  Antibiotics:     Crabs/Lice:  Ceclor       RID  Cephalosporins    AVOID:  E-Mycins      Kwell  Keflex  Macrobid/Macrodantin   Diarrhea:  Penicillin      Kao-Pectate  Zithromax      Imodium AD         PUSH FLUIDS AVOID:       Cipro     Fever:  Tetracycline      Tylenol (Regular or Extra  Minocycline       Strength)  Levaquin      Extra Strength-Do not          Exceed 8 tabs/24 hrs Caffeine:        <200mg/day (equiv. To 1 cup of coffee or  approx. 3 12 oz sodas)         Gas: Cold/Hayfever:       Gas-X  Benadryl      Mylicon  Claritin       Phazyme  **Claritin-D        Chlor-Trimeton    Headaches:  Dimetapp      ASA-Free Excedrin  Drixoral-Non-Drowsy     Cold Compress  Mucinex  (Guaifenasin)     Tylenol (Regular or Extra  Sudafed/Sudafed-12 Hour     Strength)  **Sudafed PE Pseudoephedrine   Tylenol Cold & Sinus     Vicks Vapor Rub  Zyrtec  **AVOID if Problems With Blood Pressure         Heartburn: Avoid lying down for at least 1 hour after meals  Aciphex      Maalox     Rash:  Milk of Magnesia     Benadryl    Mylanta       1% Hydrocortisone Cream  Pepcid  Pepcid Complete   Sleep Aids:  Prevacid      Ambien   Prilosec       Benadryl  Rolaids       Chamomile Tea  Tums (Limit 4/day)     Unisom  Zantac       Tylenol PM         Warm milk-add vanilla or  Hemorrhoids:       Sugar for taste  Anusol/Anusol H.C.  (RX: Analapram 2.5%)  Sugar Substitutes:  Hydrocortisone OTC     Ok in moderation  Preparation H      Tucks        Vaseline lotion applied to tissue with wiping    Herpes:     Throat:  Acyclovir      Oragel  Famvir  Valtrex     Vaccines:         Flu Shot Leg Cramps:       *Gardasil  Benadryl      Hepatitis A         Hepatitis B Nasal Spray:       Pneumovax  Saline Nasal Spray     Polio Booster         Tetanus Nausea:       Tuberculosis test or PPD  Vitamin B6 25 mg TID   AVOID:    Dramamine      *Gardasil  Emetrol       Live Poliovirus  Ginger Root 250 mg QID    MMR (measles, mumps &  High Complex Carbs @ Bedtime    rebella)  Sea Bands-Accupressure    Varicella (Chickenpox)  Unisom 1/2 tab TID     *No known complications           If received before Pain:         Known pregnancy;   Darvocet       Resume series after  Lortab        Delivery  Percocet    Yeast:   Tramadol      Femstat  Tylenol 3      Gyne-lotrimin  Ultram       Monistat  Vicodin           MISC:           All Sunscreens           Hair Coloring/highlights          Insect Repellant's          (Including DEET)         Mystic Tans

## 2017-09-22 LAB — CBC WITH DIFFERENTIAL
BASOS: 0 %
Basophils Absolute: 0 10*3/uL (ref 0.0–0.2)
EOS (ABSOLUTE): 0.1 10*3/uL (ref 0.0–0.4)
EOS: 1 %
HEMOGLOBIN: 10.9 g/dL — AB (ref 11.1–15.9)
Hematocrit: 36.2 % (ref 34.0–46.6)
Immature Grans (Abs): 0 10*3/uL (ref 0.0–0.1)
Immature Granulocytes: 0 %
LYMPHS: 31 %
Lymphocytes Absolute: 2 10*3/uL (ref 0.7–3.1)
MCH: 22.8 pg — ABNORMAL LOW (ref 26.6–33.0)
MCHC: 30.1 g/dL — ABNORMAL LOW (ref 31.5–35.7)
MCV: 76 fL — AB (ref 79–97)
Monocytes Absolute: 0.4 10*3/uL (ref 0.1–0.9)
Monocytes: 7 %
NEUTROS PCT: 61 %
Neutrophils Absolute: 4 10*3/uL (ref 1.4–7.0)
RBC: 4.78 x10E6/uL (ref 3.77–5.28)
RDW: 15.1 % (ref 12.3–15.4)
WBC: 6.4 10*3/uL (ref 3.4–10.8)

## 2017-09-22 LAB — ABO AND RH: Rh Factor: POSITIVE

## 2017-09-22 LAB — MICROSCOPIC EXAMINATION: CASTS: NONE SEEN /LPF

## 2017-09-22 LAB — ANTIBODY SCREEN: Antibody Screen: NEGATIVE

## 2017-09-22 LAB — URINALYSIS, ROUTINE W REFLEX MICROSCOPIC
Bilirubin, UA: NEGATIVE
GLUCOSE, UA: NEGATIVE
KETONES UA: NEGATIVE
Nitrite, UA: NEGATIVE
PH UA: 6 (ref 5.0–7.5)
PROTEIN UA: NEGATIVE
RBC, UA: NEGATIVE
Specific Gravity, UA: 1.022 (ref 1.005–1.030)
Urobilinogen, Ur: 0.2 mg/dL (ref 0.2–1.0)

## 2017-09-22 LAB — HIV ANTIBODY (ROUTINE TESTING W REFLEX): HIV Screen 4th Generation wRfx: NONREACTIVE

## 2017-09-22 LAB — RPR: RPR: NONREACTIVE

## 2017-09-22 LAB — VARICELLA ZOSTER ANTIBODY, IGG: VARICELLA: 313 {index} (ref >165)

## 2017-09-22 LAB — HEPATITIS B SURFACE ANTIGEN: Hepatitis B Surface Ag: NEGATIVE

## 2017-09-22 LAB — SICKLE CELL SCREEN: Sickle Cell Screen: NEGATIVE

## 2017-09-22 LAB — RUBELLA SCREEN: RUBELLA: 1.11 {index} (ref >0.99)

## 2017-09-23 LAB — URINE CULTURE

## 2017-09-24 LAB — MONITOR DRUG PROFILE 14(MW)
Amphetamine Scrn, Ur: NEGATIVE ng/mL
BARBITURATE SCREEN URINE: NEGATIVE ng/mL
BENZODIAZEPINE SCREEN, URINE: NEGATIVE ng/mL
Buprenorphine, Urine: NEGATIVE ng/mL
CANNABINOIDS UR QL SCN: NEGATIVE ng/mL
Cocaine (Metab) Scrn, Ur: NEGATIVE ng/mL
Creatinine(Crt), U: 190.8 mg/dL (ref 20.0–300.0)
Fentanyl, Urine: NEGATIVE pg/mL
METHADONE SCREEN, URINE: NEGATIVE ng/mL
Meperidine Screen, Urine: NEGATIVE ng/mL
OXYCODONE+OXYMORPHONE UR QL SCN: NEGATIVE ng/mL
Opiate Scrn, Ur: NEGATIVE ng/mL
Ph of Urine: 6 (ref 4.5–8.9)
Phencyclidine Qn, Ur: NEGATIVE ng/mL
Propoxyphene Scrn, Ur: NEGATIVE ng/mL
SPECIFIC GRAVITY: 1.016
Tramadol Screen, Urine: NEGATIVE ng/mL

## 2017-09-24 LAB — NICOTINE SCREEN, URINE: COTININE UR QL SCN: NEGATIVE ng/mL

## 2017-09-25 LAB — GC/CHLAMYDIA PROBE AMP
CHLAMYDIA, DNA PROBE: NEGATIVE
NEISSERIA GONORRHOEAE BY PCR: NEGATIVE

## 2017-10-03 ENCOUNTER — Encounter: Payer: Managed Care, Other (non HMO) | Admitting: Obstetrics and Gynecology

## 2017-10-10 ENCOUNTER — Ambulatory Visit (INDEPENDENT_AMBULATORY_CARE_PROVIDER_SITE_OTHER): Payer: Managed Care, Other (non HMO) | Admitting: Obstetrics and Gynecology

## 2017-10-10 ENCOUNTER — Encounter: Payer: Self-pay | Admitting: Obstetrics and Gynecology

## 2017-10-10 VITALS — BP 103/66 | HR 85 | Wt 171.9 lb

## 2017-10-10 DIAGNOSIS — Z3492 Encounter for supervision of normal pregnancy, unspecified, second trimester: Secondary | ICD-10-CM | POA: Diagnosis not present

## 2017-10-10 DIAGNOSIS — Z3481 Encounter for supervision of other normal pregnancy, first trimester: Secondary | ICD-10-CM

## 2017-10-10 DIAGNOSIS — Z3A12 12 weeks gestation of pregnancy: Secondary | ICD-10-CM

## 2017-10-10 LAB — POCT URINALYSIS DIPSTICK
Bilirubin, UA: NEGATIVE
Blood, UA: NEGATIVE
Glucose, UA: NEGATIVE
KETONES UA: 40
Leukocytes, UA: NEGATIVE
NITRITE UA: NEGATIVE
Spec Grav, UA: 1.01 (ref 1.010–1.025)
Urobilinogen, UA: 0.2 E.U./dL
pH, UA: 6 (ref 5.0–8.0)

## 2017-10-10 NOTE — Progress Notes (Signed)
NEW OB HISTORY AND PHYSICAL  SUBJECTIVE:       Allison [name] is a 27 y.o. [id number] female, Patient's last menstrual period was 07/08/2017 (approximate)., Estimated Date of Delivery: 04/22/18, [redacted]w[redacted]d, presents today for establishment of Prenatal Care. She has no unusual complaints and complains of nausea- much improved with Diclegis. Slight spotting see mychart.      Gynecologic History Patient's last menstrual period was 07/08/2017 (approximate). Abnormal Contraception: none Last Pap: 2018. Results were: normal  Obstetric History OB History  Gravida Para Term Preterm AB Living  SAB TAB Ectopic Multiple Live Births          1    # Outcome Date GA Lbr Len/2nd Weight Sex Delivery Anes PTL Lv  2 Current           1 Term 06/09/13 [redacted]w[redacted]d 12:09 / 01:22 6 lb 10 oz (3.005 kg) M Vag-Spont EPI  LIV    Past Medical History:  Diagnosis Date  . Asthma    exercise induced, inhaler use 8 years ago    Past Surgical History:  Procedure Laterality Date  . WISDOM TOOTH EXTRACTION      Current Outpatient Medications on File Prior to Visit  Medication Sig Dispense Refill  . Doxylamine-Pyridoxine 10-10 MG TBEC Take 10 mg by mouth daily. 2 at bedtime, 1 in the am. 1 in the pm- no more than 4 a day 60 tablet 1  . Prenatal Vit-Fe Fumarate-FA (PRENATAL MULTIVITAMIN) TABS tablet Take 1 tablet by mouth daily at 12 noon.     No current facility-administered medications on file prior to visit.     Allergies  Allergen Reactions  . Dust Mite Extract Rash  . Tree Extract Rash    Social History   Socioeconomic History  . Marital status: Single    Spouse name: Not on file  . Number of children: Not on file  . Years of education: Not on file  . Highest education level: Not on file  Occupational History  . Not on file  Social Needs  . Financial resource strain: Not on file  . Food insecurity:    Worry: Not on file    Inability: Not on file  . Transportation needs:   Medical: Not on file    Non-medical: Not on file  Tobacco Use  . Smoking status: Never Smoker  . Smokeless tobacco: Never Used  Substance and Sexual Activity  . Alcohol use: No  . Drug use: No  . Sexual activity: Yes    Birth control/protection: None  Lifestyle  . Physical activity:    Days per week: Not on file    Minutes per session: Not on file  . Stress: Not on file  Relationships  . Social connections:    Talks on phone: Not on file    Gets together: Not on file    Attends religious service: Not on file    Active member of club or organization: Not on file    Attends meetings of clubs or organizations: Not on file    Relationship status: Not on file  . Intimate partner violence:    Fear of current or ex partner: Not on file    Emotionally abused: Not on file    Physically abused: Not on file    Forced sexual activity: Not on file  Other Topics Concern  . Not on file  Social History Narrative  . Not on file  Family History  Problem Relation Age of Onset  . Breast cancer Neg Hx   . Ovarian cancer Neg Hx   . Colon cancer Neg Hx   . Diabetes Neg Hx     The following portions of the patient's history were reviewed and updated as appropriate: allergies, current medications, past OB history, past medical history, past surgical history, past family history, past social history, and problem list.    OBJECTIVE: Initial Physical Exam (New OB)  GENERAL APPEARANCE: alert, well appearing, in no apparent distress, oriented to person, place and time HEAD: normocephalic, atraumatic MOUTH: mucous membranes moist, pharynx normal without lesions and dental hygiene good THYROID: no thyromegaly or masses present BREASTS: no masses noted, no significant tenderness, no palpable axillary nodes, no skin changes LUNGS: clear to auscultation, no wheezes, rales or rhonchi, symmetric air entry HEART: regular rate and rhythm, no murmurs ABDOMEN: soft, nontender, nondistended, no  abnormal masses, no epigastric pain, fundus not palpable and FHT present EXTREMITIES: no redness or tenderness in the calves or thighs SKIN: normal coloration and turgor, no rashes LYMPH NODES: no adenopathy palpable NEUROLOGIC: alert, oriented, normal speech, no focal findings or movement disorder noted  PELVIC EXAM deferred  ASSESSMENT: Normal pregnancy  PLAN: Prenatal care Declined genetic screening. See orders

## 2017-10-10 NOTE — Patient Instructions (Signed)
Second Trimester of Pregnancy The second trimester is from week 13 through week 28, month 4 through 6. This is often the time in pregnancy that you feel your best. Often times, morning sickness has lessened or quit. You may have more energy, and you may get hungry more often. Your unborn baby (fetus) is growing rapidly. At the end of the sixth month, he or she is about 9 inches long and weighs about 1 pounds. You will likely feel the baby move (quickening) between 18 and 20 weeks of pregnancy. Follow these instructions at home:  Avoid all smoking, herbs, and alcohol. Avoid drugs not approved by your doctor.  Do not use any tobacco products, including cigarettes, chewing tobacco, and electronic cigarettes. If you need help quitting, ask your doctor. You may get counseling or other support to help you quit.  Only take medicine as told by your doctor. Some medicines are safe and some are not during pregnancy.  Exercise only as told by your doctor. Stop exercising if you start having cramps.  Eat regular, healthy meals.  Wear a good support bra if your breasts are tender.  Do not use hot tubs, steam rooms, or saunas.  Wear your seat belt when driving.  Avoid raw meat, uncooked cheese, and liter boxes and soil used by cats.  Take your prenatal vitamins.  Take 1500-2000 milligrams of calcium daily starting at the 20th week of pregnancy until you deliver your baby.  Try taking medicine that helps you poop (stool softener) as needed, and if your doctor approves. Eat more fiber by eating fresh fruit, vegetables, and whole grains. Drink enough fluids to keep your pee (urine) clear or pale yellow.  Take warm water baths (sitz baths) to soothe pain or discomfort caused by hemorrhoids. Use hemorrhoid cream if your doctor approves.  If you have puffy, bulging veins (varicose veins), wear support hose. Raise (elevate) your feet for 15 minutes, 3-4 times a day. Limit salt in your diet.  Avoid heavy  lifting, wear low heals, and sit up straight.  Rest with your legs raised if you have leg cramps or low back pain.  Visit your dentist if you have not gone during your pregnancy. Use a soft toothbrush to brush your teeth. Be gentle when you floss.  You can have sex (intercourse) unless your doctor tells you not to.  Go to your doctor visits. Get help if:  You feel dizzy.  You have mild cramps or pressure in your lower belly (abdomen).  You have a nagging pain in your belly area.  You continue to feel sick to your stomach (nauseous), throw up (vomit), or have watery poop (diarrhea).  You have bad smelling fluid coming from your vagina.  You have pain with peeing (urination). Get help right away if:  You have a fever.  You are leaking fluid from your vagina.  You have spotting or bleeding from your vagina.  You have severe belly cramping or pain.  You lose or gain weight rapidly.  You have trouble catching your breath and have chest pain.  You notice sudden or extreme puffiness (swelling) of your face, hands, ankles, feet, or legs.  You have not felt the baby move in over an hour.  You have severe headaches that do not go away with medicine.  You have vision changes. This information is not intended to replace advice given to you by your health care provider. Make sure you discuss any questions you have with your health care   provider. Document Released: 08/09/2009 Document Revised: 10/21/2015 Document Reviewed: 07/16/2012 Elsevier Interactive Patient Education  2017 [employer]  

## 2017-10-10 NOTE — Progress Notes (Signed)
NOB - pt is having some nausea- is taking Diclegis- working well

## 2017-11-08 ENCOUNTER — Ambulatory Visit (INDEPENDENT_AMBULATORY_CARE_PROVIDER_SITE_OTHER): Payer: Medicaid Other | Admitting: Certified Nurse Midwife

## 2017-11-08 VITALS — BP 105/58 | HR 82 | Wt 170.5 lb

## 2017-11-08 DIAGNOSIS — Z3689 Encounter for other specified antenatal screening: Secondary | ICD-10-CM

## 2017-11-08 DIAGNOSIS — Z3492 Encounter for supervision of normal pregnancy, unspecified, second trimester: Secondary | ICD-10-CM

## 2017-11-08 LAB — POCT URINALYSIS DIPSTICK
Bilirubin, UA: NEGATIVE
Blood, UA: NEGATIVE
Glucose, UA: NEGATIVE
Ketones, UA: NEGATIVE
Leukocytes, UA: NEGATIVE
NITRITE UA: NEGATIVE
PH UA: 6.5 (ref 5.0–8.0)
PROTEIN UA: POSITIVE — AB
Spec Grav, UA: 1.02 (ref 1.010–1.025)
UROBILINOGEN UA: 0.2 U/dL

## 2017-11-08 NOTE — Progress Notes (Signed)
ROB-Reports pelvic pressure and round ligament pain. Abdominal support distributed in office, form reviewed and signed. Discussed home treatment measures. Anticipatory guidance regarding course of prenatal care. Reviewed red flag symptoms and when to call. RTC x 4 weeks for anatomy scan and ROB or sooner if needed.

## 2017-11-08 NOTE — Progress Notes (Signed)
Pt is here for an ROB visit. 

## 2017-11-08 NOTE — Patient Instructions (Signed)
Back Pain in Pregnancy Back pain during pregnancy is common. Back pain may be caused by several factors that are related to changes during your pregnancy. Follow these instructions at home: Managing pain, stiffness, and swelling  If directed, apply ice for sudden (acute) back pain. ? Put ice in a plastic bag. ? Place a towel between your skin and the bag. ? Leave the ice on for 20 minutes, 2-3 times per day.  If directed, apply heat to the affected area before you exercise: ? Place a towel between your skin and the heat pack or heating pad. ? Leave the heat on for 20-30 minutes. ? Remove the heat if your skin turns bright red. This is especially important if you are unable to feel pain, heat, or cold. You may have a greater risk of getting burned. Activity  Exercise as told by your health care provider. Exercising is the best way to prevent or manage back pain.  Listen to your body when lifting. If lifting hurts, ask for help or bend your knees. This uses your leg muscles instead of your back muscles.  Squat down when picking up something from the floor. Do not bend over.  Only use bed rest as told by your health care provider. Bed rest should only be used for the most severe episodes of back pain. Standing, Sitting, and Lying Down  Do not stand in one place for long periods of time.  Use good posture when sitting. Make sure your head rests over your shoulders and is not hanging forward. Use a pillow on your lower back if necessary.  Try sleeping on your side, preferably the left side, with a pillow or two between your legs. If you are sore after a night's rest, your bed may be too soft. A firm mattress may provide more support for your back during pregnancy. General instructions  Do not wear high heels.  Eat a healthy diet. Try to gain weight within your health care provider's recommendations.  Use a maternity girdle, elastic sling, or back brace as told by your health care  provider.  Take over-the-counter and prescription medicines only as told by your health care provider.  Keep all follow-up visits as told by your health care provider. This is important. This includes any visits with any specialists, such as a physical therapist. Contact a health care provider if:  Your back pain interferes with your daily activities.  You have increasing pain in other parts of your body. Get help right away if:  You develop numbness, tingling, weakness, or problems with the use of your arms or legs.  You develop severe back pain that is not controlled with medicine.  You have a sudden change in bowel or bladder control.  You develop shortness of breath, dizziness, or you faint.  You develop nausea, vomiting, or sweating.  You have back pain that is a rhythmic, cramping pain similar to labor pains. Labor pain is usually 1-2 minutes apart, lasts for about 1 minute, and involves a bearing down feeling or pressure in your pelvis.  You have back pain and your water breaks or you have vaginal bleeding.  You have back pain or numbness that travels down your leg.  Your back pain developed after you fell.  You develop pain on one side of your back.  You see blood in your urine.  You develop skin blisters in the area of your back pain. This information is not intended to replace advice given to you   by your health care provider. Make sure you discuss any questions you have with your health care provider. Document Released: 08/23/2005 Document Revised: 10/21/2015 Document Reviewed: 01/27/2015 Elsevier Interactive Patient Education  2018 [place]. Abdominal Pain During Pregnancy Belly (abdominal) pain is common during pregnancy. Most of the time, it is not a serious problem. Other times, it can be a sign that something is wrong with the pregnancy. Always tell your doctor if you have belly pain. Follow these instructions at home: Monitor your belly pain for any  changes. The following actions may help you feel better:  Do not have sex (intercourse) or put anything in your vagina until you feel better.  Rest until your pain stops.  Drink clear fluids if you feel sick to your stomach (nauseous). Do not eat solid food until you feel better.  Only take medicine as told by your doctor.  Keep all doctor visits as told.  Get help right away if:  You are bleeding, leaking fluid, or pieces of tissue come out of your vagina.  You have more pain or cramping.  You keep throwing up (vomiting).  You have pain when you pee (urinate) or have blood in your pee.  You have a fever.  You do not feel your baby moving as much.  You feel very weak or feel like passing out.  You have trouble breathing, with or without belly pain.  You have a very bad headache and belly pain.  You have fluid leaking from your vagina and belly pain.  You keep having watery poop (diarrhea).  Your belly pain does not go away after resting, or the pain gets worse. This information is not intended to replace advice given to you by your health care provider. Make sure you discuss any questions you have with your health care provider. Document Released: 05/03/2009 Document Revised: 12/22/2015 Document Reviewed: 12/12/2012 Elsevier Interactive Patient Education  2018 [employer]. Common Medications Safe in Pregnancy  Acne:      Constipation:  Benzoyl Peroxide     Colace  Clindamycin      Dulcolax Suppository  Topica Erythromycin     Fibercon  Salicylic Acid      Metamucil         Miralax AVOID:        Senakot   Accutane    Cough:  Retin-A       Cough Drops  Tetracycline      Phenergan w/ Codeine if Rx  Minocycline      Robitussin (Plain & DM)  Antibiotics:     Crabs/Lice:  Ceclor       RID  Cephalosporins    AVOID:  E-Mycins      Kwell  Keflex  Macrobid/Macrodantin   Diarrhea:  Penicillin      Kao-Pectate  Zithromax      Imodium AD         PUSH  FLUIDS AVOID:       Cipro     Fever:  Tetracycline      Tylenol (Regular or Extra  Minocycline       Strength)  Levaquin      Extra Strength-Do not          Exceed 8 tabs/24 hrs Caffeine:        <262m/day (equiv. To 1 cup of coffee or  approx. 3 12 oz sodas)         Gas: Cold/Hayfever:       Gas-X  Benadryl  Mylicon  Claritin       Phazyme  **Claritin-D        Chlor-Trimeton    Headaches:  Dimetapp      ASA-Free Excedrin  Drixoral-Non-Drowsy     Cold Compress  Mucinex (Guaifenasin)     Tylenol (Regular or Extra  Sudafed/Sudafed-12 Hour     Strength)  **Sudafed PE Pseudoephedrine   Tylenol Cold & Sinus     Vicks Vapor Rub  Zyrtec  **AVOID if Problems With Blood Pressure         Heartburn: Avoid lying down for at least 1 hour after meals  Aciphex      Maalox     Rash:  Milk of Magnesia     Benadryl    Mylanta       1% Hydrocortisone Cream  Pepcid  Pepcid Complete   Sleep Aids:  Prevacid      Ambien   Prilosec       Benadryl  Rolaids       Chamomile Tea  Tums (Limit 4/day)     Unisom  Zantac       Tylenol PM         Warm milk-add vanilla or  Hemorrhoids:       Sugar for taste  Anusol/Anusol H.C.  (RX: Analapram 2.5%)  Sugar Substitutes:  Hydrocortisone OTC     Ok in moderation  Preparation H      Tucks        Vaseline lotion applied to tissue with wiping    Herpes:     Throat:  Acyclovir      Oragel  Famvir  Valtrex     Vaccines:         Flu Shot Leg Cramps:       *Gardasil  Benadryl      Hepatitis A         Hepatitis B Nasal Spray:       Pneumovax  Saline Nasal Spray     Polio Booster         Tetanus Nausea:       Tuberculosis test or PPD  Vitamin B6 25 mg TID   AVOID:    Dramamine      *Gardasil  Emetrol       Live Poliovirus  Ginger Root 250 mg QID    MMR (measles, mumps &  High Complex Carbs @ Bedtime    rebella)  Sea Bands-Accupressure    Varicella (Chickenpox)  Unisom 1/2 tab TID     *No known complications           If received  before Pain:         Known pregnancy;   Darvocet       Resume series after  Lortab        Delivery  Percocet    Yeast:   Tramadol      Femstat  Tylenol 3      Gyne-lotrimin  Ultram       Monistat  Vicodin           MISC:         All Sunscreens           Hair Coloring/highlights          Insect Repellant's          (Including DEET)         Mystic Tans

## 2017-11-12 ENCOUNTER — Telehealth: Payer: Self-pay | Admitting: Certified Nurse Midwife

## 2017-11-12 NOTE — Telephone Encounter (Signed)
The patient called about her Diclegis refill.  She said the pharmacy was requesting a preauthorization due to her insurance changing.  [facility] stated [facility] [address] should have already sent over a request so this is just a follow up for now.  I asked the patient to call the pharmacy back to verify it was refilled and if she had any issues to contact our office after that.  **NO FURTHER ACTION AT THIS TIME**  Thanks

## 2017-11-13 ENCOUNTER — Encounter: Payer: Self-pay | Admitting: Certified Nurse Midwife

## 2017-11-15 ENCOUNTER — Other Ambulatory Visit: Payer: Medicaid Other

## 2017-11-16 ENCOUNTER — Other Ambulatory Visit: Payer: Medicaid Other

## 2017-11-16 ENCOUNTER — Telehealth: Payer: Self-pay | Admitting: Certified Nurse Midwife

## 2017-11-16 DIAGNOSIS — Z202 Contact with and (suspected) exposure to infections with a predominantly sexual mode of transmission: Secondary | ICD-10-CM

## 2017-11-18 LAB — GC/CHLAMYDIA PROBE AMP
Chlamydia trachomatis, NAA: NEGATIVE
Neisseria gonorrhoeae by PCR: NEGATIVE

## 2017-11-19 ENCOUNTER — Telehealth: Payer: Self-pay | Admitting: Certified Nurse Midwife

## 2017-11-19 NOTE — Telephone Encounter (Signed)
Patient called requesting lab results from Friday. She also states she is experiencing vaginal discharge. Thanks

## 2017-12-06 ENCOUNTER — Ambulatory Visit (INDEPENDENT_AMBULATORY_CARE_PROVIDER_SITE_OTHER): Payer: Medicaid Other | Admitting: Certified Nurse Midwife

## 2017-12-06 ENCOUNTER — Other Ambulatory Visit: Payer: Self-pay

## 2017-12-06 ENCOUNTER — Observation Stay
Admission: EM | Admit: 2017-12-06 | Discharge: 2017-12-07 | Disposition: A | Discharge status: Home / Self Care | Payer: Medicaid Other | Attending: Certified Nurse Midwife | Admitting: Certified Nurse Midwife

## 2017-12-06 ENCOUNTER — Ambulatory Visit (INDEPENDENT_AMBULATORY_CARE_PROVIDER_SITE_OTHER): Payer: Medicaid Other

## 2017-12-06 VITALS — BP 96/63 | HR 99 | Wt 171.1 lb

## 2017-12-06 DIAGNOSIS — O209 Hemorrhage in early pregnancy, unspecified: Secondary | ICD-10-CM | POA: Diagnosis present

## 2017-12-06 DIAGNOSIS — Z3A20 20 weeks gestation of pregnancy: Secondary | ICD-10-CM

## 2017-12-06 DIAGNOSIS — Z3492 Encounter for supervision of normal pregnancy, unspecified, second trimester: Secondary | ICD-10-CM

## 2017-12-06 DIAGNOSIS — Z363 Encounter for antenatal screening for malformations: Secondary | ICD-10-CM

## 2017-12-06 DIAGNOSIS — O4692 Antepartum hemorrhage, unspecified, second trimester: Secondary | ICD-10-CM | POA: Diagnosis not present

## 2017-12-06 DIAGNOSIS — O26892 Other specified pregnancy related conditions, second trimester: Secondary | ICD-10-CM

## 2017-12-06 DIAGNOSIS — Z3490 Encounter for supervision of normal pregnancy, unspecified, unspecified trimester: Secondary | ICD-10-CM

## 2017-12-06 DIAGNOSIS — J3089 Other allergic rhinitis: Secondary | ICD-10-CM | POA: Insufficient documentation

## 2017-12-06 DIAGNOSIS — Z3689 Encounter for other specified antenatal screening: Secondary | ICD-10-CM

## 2017-12-06 DIAGNOSIS — N898 Other specified noninflammatory disorders of vagina: Secondary | ICD-10-CM

## 2017-12-06 LAB — POCT URINALYSIS DIPSTICK
Bilirubin, UA: NEGATIVE
Blood, UA: NEGATIVE
Glucose, UA: NEGATIVE
Leukocytes, UA: NEGATIVE
NITRITE UA: NEGATIVE
PROTEIN UA: NEGATIVE
SPEC GRAV UA: 1.025 (ref 1.010–1.025)
Urobilinogen, UA: 0.2 E.U./dL
pH, UA: 6 (ref 5.0–8.0)

## 2017-12-06 LAB — OB RESULTS CONSOLE GC/CHLAMYDIA: GC PROBE AMP, GENITAL: NEGATIVE

## 2017-12-06 NOTE — Progress Notes (Signed)
Pt is here for an ROB visit. 

## 2017-12-06 NOTE — OB Triage Note (Signed)
Patient arrived in triage with c/o vaginal bleeding noted on toilet tissue when wiping after voiding approx. 30 mins ago.  Reported good fetal movement. Denies pain or contractions at this time, but did have some abdominal/back pain earlier in the evening. Reports being seen in the office today for anatomy ultrasound and having a swab done at that time due to increase in thick white vaginal discharge. Reports that the swab was "very painful". Noticed that it was also very painful to pee the last time she voided and that there were "blood clots" when she wiped. Scant amount of brown discharge noted on peripad. No new bleeding seen at this time. FHT's dopplered as documented. Abdomen palpates soft and nontender to touch. External toco explained and applied.

## 2017-12-06 NOTE — Patient Instructions (Signed)
Eating Plan for Pregnant Women While you are pregnant, your body will require additional nutrition to help support your growing baby. It is recommended that you consume:  150 additional calories each day during your first trimester.  300 additional calories each day during your second trimester.  300 additional calories each day during your third trimester.  Eating a healthy, well-balanced diet is very important for your health and for your baby's health. You also have a higher need for some vitamins and minerals, such as folic acid, calcium, iron, and vitamin D. What do I need to know about eating during pregnancy?  Do not try to lose weight or go on a diet during pregnancy.  Choose healthy, nutritious foods. Choose  of a sandwich with a glass of milk instead of a candy bar or a high-calorie sugar-sweetened beverage.  Limit your overall intake of foods that have "empty calories." These are foods that have little nutritional value, such as sweets, desserts, candies, sugar-sweetened beverages, and fried foods.  Eat a variety of foods, especially fruits and vegetables.  Take a prenatal vitamin to help meet the additional needs during pregnancy, specifically for folic acid, iron, calcium, and vitamin D.  Remember to stay active. Ask your health care provider for exercise recommendations that are specific to you.  Practice good food safety and cleanliness, such as washing your hands before you eat and after you prepare raw meat. This helps to prevent foodborne illnesses, such as listeriosis, that can be very dangerous for your baby. Ask your health care provider for more information about listeriosis. What does 150 extra calories look like? Healthy options for an additional 150 calories each day could be any of the following:  Plain low-fat yogurt (6-8 oz) with  cup of berries.  1 apple with 2 teaspoons of peanut butter.  Cut-up vegetables with  cup of hummus.  Low-fat chocolate milk  (8 oz or 1 cup).  1 string cheese with 1 medium orange.   of a peanut butter and jelly sandwich on whole-wheat bread (1 tsp of peanut butter).  For 300 calories, you could eat two of those healthy options each day. What is a healthy amount of weight to gain? The recommended amount of weight for you to gain is based on your pre-pregnancy BMI. If your pre-pregnancy BMI was:  Less than 18 (underweight), you should gain 28-40 lb.  18-24.9 (normal), you should gain 25-35 lb.  25-29.9 (overweight), you should gain 15-25 lb.  Greater than 30 (obese), you should gain 11-20 lb.  What if I am having twins or multiples? Generally, pregnant women who will be having twins or multiples may need to increase their daily calories by 300-600 calories each day. The recommended range for total weight gain is 25-54 lb, depending on your pre-pregnancy BMI. Talk with your health care provider for specific guidance about additional nutritional needs, weight gain, and exercise during your pregnancy. What foods can I eat? Grains Any grains. Try to choose whole grains, such as whole-wheat bread, oatmeal, or brown rice. Vegetables Any vegetables. Try to eat a variety of colors and types of vegetables to get a full range of vitamins and minerals. Remember to wash your vegetables well before eating. Fruits Any fruits. Try to eat a variety of colors and types of fruit to get a full range of vitamins and minerals. Remember to wash your fruits well before eating. Meats and Other Protein Sources Lean meats, including chicken, [place], fish, and lean cuts of beef, veal,  or pork. Make sure that all meats are cooked to "well done." Tofu. Tempeh. Beans. Eggs. Peanut butter and other nut butters. Seafood, such as shrimp, crab, and lobster. If you choose fish, select types that are higher in omega-3 fatty acids, including salmon, herring, mussels, trout, sardines, and pollock. Make sure that all meats are cooked to food-safe  temperatures. Dairy Pasteurized milk and milk alternatives. Pasteurized yogurt and pasteurized cheese. Cottage cheese. Sour cream. Beverages Water. Juices that contain 100% fruit juice or vegetable juice. Caffeine-free teas and decaffeinated coffee. Drinks that contain caffeine are okay to drink, but it is better to avoid caffeine. Keep your total caffeine intake to less than 200 mg each day (12 oz of coffee, tea, or soda) or as directed by your health care provider. Condiments Any pasteurized condiments. Sweets and Desserts Any sweets and desserts. Fats and Oils Any fats and oils. The items listed above may not be a complete list of recommended foods or beverages. Contact your dietitian for more options. What foods are not recommended? Vegetables Unpasteurized (raw) vegetable juices. Fruits Unpasteurized (raw) fruit juices. Meats and Other Protein Sources Cured meats that have nitrates, such as bacon, salami, and hotdogs. Luncheon meats, bologna, or other deli meats (unless they are reheated until they are steaming hot). Refrigerated pate, meat spreads from a meat counter, smoked seafood that is found in the refrigerated section of a store. Raw fish, such as sushi or sashimi. High mercury content fish, such as tilefish, shark, swordfish, and king mackerel. Raw meats, such as tuna or beef tartare. Undercooked meats and poultry. Make sure that all meats are cooked to food-safe temperatures. Dairy Unpasteurized (raw) milk and any foods that have raw milk in them. Soft cheeses, such as feta, queso blanco, queso fresco, Brie, Camembert cheeses, blue-veined cheeses, and Panela cheese (unless it is made with pasteurized milk, which must be stated on the label). Beverages Alcohol. Sugar-sweetened beverages, such as sodas, teas, or energy drinks. Condiments Homemade fermented foods and drinks, such as pickles, sauerkraut, or kombucha drinks. (Store-bought pasteurized versions of these are  okay.) Other Salads that are made in the store, such as ham salad, chicken salad, egg salad, tuna salad, and seafood salad. The items listed above may not be a complete list of foods and beverages to avoid. Contact your dietitian for more information. This information is not intended to replace advice given to you by your health care provider. Make sure you discuss any questions you have with your health care provider. Document Released: 02/27/2014 Document Revised: 10/21/2015 Document Reviewed: 10/28/2013 Elsevier Interactive Patient Education  2018 [place]. WHAT OB PATIENTS CAN EXPECT   Confirmation of pregnancy and ultrasound ordered if medically indicated-[redacted] weeks gestation  New OB (NOB) intake with nurse and New OB (NOB) labs- [redacted] weeks gestation  New OB (NOB) physical examination with provider- 11/[redacted] weeks gestation  Flu vaccine-[redacted] weeks gestation  Anatomy scan-[redacted] weeks gestation  Glucose tolerance test, blood work to test for anemia, T-dap vaccine-[redacted] weeks gestation  Vaginal swabs/cultures-STD/Group B strep-[redacted] weeks gestation  Appointments every 4 weeks until 28 weeks  Every 2 weeks from 28 weeks until 36 weeks  Weekly visits from 36 weeks until delivery  Round Ligament Pain The round ligament is a cord of muscle and tissue that helps to support the uterus. It can become a source of pain during pregnancy if it becomes stretched or twisted as the baby grows. The pain usually begins in the second trimester of pregnancy, and it can come  and go until the baby is delivered. It is not a serious problem, and it does not cause harm to the baby. Round ligament pain is usually a short, sharp, and pinching pain, but it can also be a dull, lingering, and aching pain. The pain is felt in the lower side of the abdomen or in the groin. It usually starts deep in the groin and moves up to the outside of the hip area. Pain can occur with:  A sudden change in position.  Rolling over in  bed.  Coughing or sneezing.  Physical activity.  Follow these instructions at home: Watch your condition for any changes. Take these steps to help with your pain:  When the pain starts, relax. Then try: ? Sitting down. ? Flexing your knees up to your abdomen. ? Lying on your side with one pillow under your abdomen and another pillow between your legs. ? Sitting in a warm bath for 15-20 minutes or until the pain goes away.  Take over-the-counter and prescription medicines only as told by your health care provider.  Move slowly when you sit and stand.  Avoid long walks if they cause pain.  Stop or lessen your physical activities if they cause pain.  Contact a health care provider if:  Your pain does not go away with treatment.  You feel pain in your back that you did not have before.  Your medicine is not helping. Get help right away if:  You develop a fever or chills.  You develop uterine contractions.  You develop vaginal bleeding.  You develop nausea or vomiting.  You develop diarrhea.  You have pain when you urinate. This information is not intended to replace advice given to you by your health care provider. Make sure you discuss any questions you have with your health care provider. Document Released: 02/22/2008 Document Revised: 10/21/2015 Document Reviewed: 07/22/2014 Elsevier Interactive Patient Education  2018 [place]. Back Pain in Pregnancy Back pain during pregnancy is common. Back pain may be caused by several factors that are related to changes during your pregnancy. Follow these instructions at home: Managing pain, stiffness, and swelling  If directed, apply ice for sudden (acute) back pain. ? Put ice in a plastic bag. ? Place a towel between your skin and the bag. ? Leave the ice on for 20 minutes, 2-3 times per day.  If directed, apply heat to the affected area before you exercise: ? Place a towel between your skin and the heat pack or  heating pad. ? Leave the heat on for 20-30 minutes. ? Remove the heat if your skin turns bright red. This is especially important if you are unable to feel pain, heat, or cold. You may have a greater risk of getting burned. Activity  Exercise as told by your health care provider. Exercising is the best way to prevent or manage back pain.  Listen to your body when lifting. If lifting hurts, ask for help or bend your knees. This uses your leg muscles instead of your back muscles.  Squat down when picking up something from the floor. Do not bend over.  Only use bed rest as told by your health care provider. Bed rest should only be used for the most severe episodes of back pain. Standing, Sitting, and Lying Down  Do not stand in one place for long periods of time.  Use good posture when sitting. Make sure your head rests over your shoulders and is not hanging forward. Use a  pillow on your lower back if necessary.  Try sleeping on your side, preferably the left side, with a pillow or two between your legs. If you are sore after a night's rest, your bed may be too soft. A firm mattress may provide more support for your back during pregnancy. General instructions  Do not wear high heels.  Eat a healthy diet. Try to gain weight within your health care provider's recommendations.  Use a maternity girdle, elastic sling, or back brace as told by your health care provider.  Take over-the-counter and prescription medicines only as told by your health care provider.  Keep all follow-up visits as told by your health care provider. This is important. This includes any visits with any specialists, such as a physical therapist. Contact a health care provider if:  Your back pain interferes with your daily activities.  You have increasing pain in other parts of your body. Get help right away if:  You develop numbness, tingling, weakness, or problems with the use of your arms or legs.  You develop  severe back pain that is not controlled with medicine.  You have a sudden change in bowel or bladder control.  You develop shortness of breath, dizziness, or you faint.  You develop nausea, vomiting, or sweating.  You have back pain that is a rhythmic, cramping pain similar to labor pains. Labor pain is usually 1-2 minutes apart, lasts for about 1 minute, and involves a bearing down feeling or pressure in your pelvis.  You have back pain and your water breaks or you have vaginal bleeding.  You have back pain or numbness that travels down your leg.  Your back pain developed after you fell.  You develop pain on one side of your back.  You see blood in your urine.  You develop skin blisters in the area of your back pain. This information is not intended to replace advice given to you by your health care provider. Make sure you discuss any questions you have with your health care provider. Document Released: 08/23/2005 Document Revised: 10/21/2015 Document Reviewed: 01/27/2015 Elsevier Interactive Patient Education  [employer]2018 [employer] Inc[employer]

## 2017-12-06 NOTE — Progress Notes (Signed)
ROB-Reports increased vaginal discharge, significant other treated for GNU. NuSwab collected, see orders; will contact patient with results. Anatomy scan normal and complete. Anticipatory guidance regarding course of prenatal care. Reviewed red flag symptoms and when to call. RTC x 4 weeks for ROB or sooner if needed.   ULTRASOUND REPORT  Location: [facility] Date of Service:  12/06/2017  Indications: Anatomy Findings:  Singleton intrauterine pregnancy is visualized with FHR at 160 BPM. Biometrics give an (U/S) Gestational age of [redacted] weeks and an (U/S) EDD of 04/25/18; this correlates with the clinically established EDD of 04/22/18.  Fetal presentation is breech.  EFW: 332 grams (0lb 12oz). Placenta: Anterior and grade 1. AFI: WNL subjectively.  Anatomic survey is complete and appears WNL; Gender - Female.   Right Ovary measures 1.8 x 1.4 x 1.6 cm. It is normal in appearance. Left Ovary measures 1.8 x 1.4 x 1.6 cm. It is normal appearance. There is no obvious evidence of a corpus luteal cyst. Survey of the adnexa demonstrates no adnexal masses. There is no free peritoneal fluid in the cul de sac.  Impression: 1. 20 week Viable Singleton Intrauterine pregnancy by U/S. 2. (U/S) EDD is consistent with Clinically established (LMP) EDD of 04/22/18. 3. Normal Anatomy Scan  Recommendations: 1.Clinical correlation with the patient's History and Physical Exam.

## 2017-12-07 DIAGNOSIS — O4692 Antepartum hemorrhage, unspecified, second trimester: Secondary | ICD-10-CM | POA: Diagnosis not present

## 2017-12-07 DIAGNOSIS — O26852 Spotting complicating pregnancy, second trimester: Secondary | ICD-10-CM

## 2017-12-07 DIAGNOSIS — Z3A20 20 weeks gestation of pregnancy: Secondary | ICD-10-CM

## 2017-12-07 LAB — URINALYSIS, ROUTINE W REFLEX MICROSCOPIC
BILIRUBIN URINE: NEGATIVE
Glucose, UA: NEGATIVE mg/dL
Ketones, ur: 5 mg/dL — AB
Nitrite: NEGATIVE
Protein, ur: 30 mg/dL — AB
Specific Gravity, Urine: 1.028 (ref 1.005–1.030)
pH: 6 (ref 5.0–8.0)

## 2017-12-07 LAB — CHLAMYDIA/NGC RT PCR (ARMC ONLY)
Chlamydia Tr: NOT DETECTED
N gonorrhoeae: NOT DETECTED

## 2017-12-07 MED ORDER — NITROFURANTOIN MONOHYD MACRO 100 MG PO CAPS: 100.0000 mg | ORAL_CAPSULE | Freq: Two times a day (BID) | ORAL | 0 refills | Status: DC

## 2017-12-07 MED ORDER — PHENAZOPYRIDINE HCL 200 MG PO TABS
200.0000 mg | ORAL_TABLET | Freq: Once | ORAL | Status: AC
  Administered 2017-12-07: 200 mg via ORAL
  Filled 2017-12-07: qty 1

## 2017-12-07 MED ORDER — PHENAZOPYRIDINE HCL 200 MG PO TABS: 200.0000 mg | ORAL_TABLET | Freq: Three times a day (TID) | ORAL | 0 refills | Status: DC | PRN

## 2017-12-07 MED ORDER — NITROFURANTOIN MONOHYD MACRO 100 MG PO CAPS
100.0000 mg | ORAL_CAPSULE | Freq: Once | ORAL | Status: AC
  Administered 2017-12-07: 100 mg via ORAL
  Filled 2017-12-07: qty 1

## 2017-12-07 NOTE — OB Triage Note (Signed)
Patient given discharge instructions as per AVS and verbalized understanding. All questions answered. Patient discharged ambulatory in stable condition accompanied by significant other and RN.

## 2017-12-07 NOTE — Discharge Summary (Signed)
Obstetric Discharge Summary  Patient ID: SINAI ILLINGWORTH MRN: [id number] DOB/AGE: 29/07/1990 29 y.o.   Date of Admission: 12/06/2017 Serafina Royals, CNM Charlena Cross, MD)  Date of Discharge: 12/07/2017 Serafina Royals, CNM Charlena Cross, MD)  Admitting Diagnosis: Observation at [redacted]w[redacted]d     Discharge Diagnosis: No other diagnosis   Antepartum Procedures: Fetal heart tones via doppler, continuous tocodynameter, UA, Urine culture, and GC/Ch   Brief Hospital Course   L&D OB Triage Note  WINIFRED BODIFORD is a 27 y.o. [id number] female at [redacted]w[redacted]d, EDD Estimated Date of Delivery: 04/22/18 who presented to triage for complaints of vaginal bleeding.  She was evaluated by the nurses with no significant findings for preterm labor or fetal distress. Vital signs stable.  She was treated with Macrobid and Pyridium.    Indications: vaginal bleeding  Mode: Doppler Baseline Rate (A): 150 bpm(to 155) Contraction Frequency (min): none  Labs:  POCT urinalysis dipstick     Status: None   Collection Time: 12/06/17  2:08 PM  Result Value Ref Range   Color, UA orange    Clarity, UA cloudy    Glucose, UA Negative Negative   Bilirubin, UA neg    Ketones, UA mod    Spec Grav, UA 1.025 1.010 - 1.025   Blood, UA neg    pH, UA 6.0 5.0 - 8.0   Protein, UA Negative Negative   Urobilinogen, UA 0.2 0.2 or 1.0 E.U./dL   Nitrite, UA neg    Leukocytes, UA Negative Negative   Appearance     Odor    Urinalysis, Routine w reflex microscopic     Status: Abnormal   Collection Time: 12/06/17 10:30 PM  Result Value Ref Range   Color, Urine YELLOW (A) YELLOW   APPearance HAZY (A) CLEAR   Specific Gravity, Urine 1.028 1.005 - 1.030   pH 6.0 5.0 - 8.0   Glucose, UA NEGATIVE NEGATIVE mg/dL   Hgb urine dipstick LARGE (A) NEGATIVE   Bilirubin Urine NEGATIVE NEGATIVE   Ketones, ur 5 (A) NEGATIVE mg/dL   Protein, ur 30 (A) NEGATIVE mg/dL   Nitrite NEGATIVE NEGATIVE   Leukocytes, UA LARGE (A) NEGATIVE   RBC / HPF  >50 (H) 0 - 5 RBC/hpf   WBC, UA 21-50 0 - 5 WBC/hpf   Bacteria, UA RARE (A) NONE SEEN   Squamous Epithelial / LPF 6-10 0 - 5   Mucus PRESENT    Ca Oxalate Crys, UA PRESENT     Comment: Performed at [facility], [address] [place], [place] [address]  Chlamydia/NGC rt PCR [facility] only)     Status: None   Collection Time: 12/06/17 10:30 PM  Result Value Ref Range   Specimen source GC/Chlam URINE, RANDOM    Chlamydia Tr NOT DETECTED NOT DETECTED   N gonorrhoeae NOT DETECTED NOT DETECTED    Comment: (NOTE) 100  This methodology has not been evaluated in pregnant women or in 200  patients with a history of hysterectomy. 300 400  This methodology will not be performed on patients less than 43  years of age. Performed at [facility], [address], [place], [place] [address]     Plan:  She was discharged home with bleeding/labor precautions. Rx: Macrobid and Pyridium, see orders. Continue routine prenatal care. Follow up with CNM as previously scheduled.    Discharge Instructions: Per After Visit Summary. Activity: Advance as tolerated. Pelvic rest for 6 weeks.  Also refer to After Visit Summary Diet: Regular  Medications: Allergies as of 12/07/2017      Reactions   Dust Mite Extract Rash   Tree Extract Rash      Medication List    TAKE these medications   nitrofurantoin (macrocrystal-monohydrate) 100 MG capsule Commonly known as:  MACROBID Take 1 capsule (100 mg total) by mouth 2 (two) times daily.   phenazopyridine 200 MG tablet Commonly known as:  PYRIDIUM Take 1 tablet (200 mg total) by mouth 3 (three) times daily as needed for pain.   prenatal multivitamin Tabs tablet Take 1 tablet by mouth daily at 12 noon.   pyridOXINE 50 MG tablet Commonly known as:  B-6 Take 100 mg by mouth daily.       Discharged Condition: stable  Discharged to: home   Gunnar BullaJenkins Michelle Lawhorn, CNM [facility], [facility]

## 2017-12-08 LAB — CULTURE, OB URINE: Culture: 80000 — AB

## 2017-12-13 ENCOUNTER — Other Ambulatory Visit: Payer: Self-pay | Admitting: Certified Nurse Midwife

## 2017-12-13 ENCOUNTER — Encounter: Payer: Self-pay | Admitting: Certified Nurse Midwife

## 2017-12-13 LAB — NUSWAB VAGINITIS PLUS (VG+)
Candida albicans, NAA: POSITIVE — AB
Candida glabrata, NAA: NEGATIVE
Chlamydia trachomatis, NAA: NEGATIVE
NEISSERIA GONORRHOEAE, NAA: NEGATIVE
TRICH VAG BY NAA: NEGATIVE

## 2017-12-13 MED ORDER — TERCONAZOLE 0.4 % VA CREA: 1.0000 | TOPICAL_CREAM | Freq: Every day | VAGINAL | 0 refills | Status: DC

## 2018-01-03 ENCOUNTER — Encounter: Payer: Self-pay | Admitting: Certified Nurse Midwife

## 2018-01-04 ENCOUNTER — Ambulatory Visit (INDEPENDENT_AMBULATORY_CARE_PROVIDER_SITE_OTHER): Payer: Medicaid Other | Admitting: Certified Nurse Midwife

## 2018-01-04 ENCOUNTER — Encounter: Payer: Medicaid Other | Admitting: Obstetrics and Gynecology

## 2018-01-04 VITALS — BP 93/52 | HR 85 | Wt 173.4 lb

## 2018-01-04 DIAGNOSIS — Z3492 Encounter for supervision of normal pregnancy, unspecified, second trimester: Secondary | ICD-10-CM

## 2018-01-04 LAB — POCT URINALYSIS DIPSTICK
Bilirubin, UA: NEGATIVE
GLUCOSE UA: NEGATIVE
Ketones, UA: 5
Leukocytes, UA: NEGATIVE
Nitrite, UA: NEGATIVE
Protein, UA: POSITIVE — AB
RBC UA: NEGATIVE
SPEC GRAV UA: 1.01 (ref 1.010–1.025)
Urobilinogen, UA: 0.2 E.U./dL
pH, UA: 6.5 (ref 5.0–8.0)

## 2018-01-04 NOTE — Progress Notes (Signed)
ROB- pt is doing well 

## 2018-01-04 NOTE — Patient Instructions (Signed)
Pain Relief During Labor and Delivery Many things can cause pain during labor and delivery, including:  Pressure on bones and ligaments due to the baby moving through the pelvis.  Stretching of tissues due to the baby moving through the birth canal.  Muscle tension due to anxiety or nervousness.  The uterus tightening (contracting) and relaxing to help move the baby.  There are many ways to deal with the pain of labor and delivery. They include:  Taking prenatal classes. Taking these classes helps you know what to expect during your baby's birth. What you learn will increase your confidence and decrease your anxiety.  Practicing relaxation techniques or doing relaxing activities, such as: ? Focused breathing. ? Meditation. ? Visualization. ? Aroma therapy. ? Listening to your favorite music. ? Hypnosis.  Taking a warm shower or bath (hydrotherapy). This may: ? Provide comfort and relaxation. ? Lessen your perception of pain. ? Decrease the amount of pain medicine needed. ? Decrease the length of labor.  Getting a massage or counterpressure on your back.  Applying warm packs or ice packs.  Changing positions often, moving around, or using a birthing ball.  Getting: ? Pain medicine through an IV or injection into a muscle. ? Pain medicine inserted into your spinal column. ? Injections of sterile water just under the skin on your lower back (intradermal injections). ? Laughing gas (nitrous oxide).  Discuss your pain control options with your health care provider during your prenatal visits. Explore the options offered by your hospital or birth center. What kinds of medicine are available? There are two kinds of medicines that can be used to relieve pain during labor and delivery:  Analgesics. These medicines decrease pain without causing you to lose feeling or the ability to move your muscles.  Anesthetics. These medicines block feeling in the body and can decrease your  ability to move freely.  Both of these kinds of medicine can cause minor side effects, such as nausea, trouble concentrating, and sleepiness. They can also decrease the baby's heart rate before birth and affect the baby's breathing rate after birth. For this reason, health care providers are careful about when and how much medicine is given. What are specific medicines and procedures that provide pain relief? Local Anesthetics Local anesthetics are used to numb a small area of the body. They may be used along with another kind of anesthetic or used to numb the nerves of the vagina, cervix, and perineum during the second stage of labor. General Anesthetics General anesthetics cause you to lose consciousness so you do not feel pain. They are usually only used for an emergency cesarean delivery. General anesthetics are given through an IV tube and a mask. Pudendal Block A pudendal block is a form of local anesthetic. It may be used to relieve the pain associated with pushing or stretching of the perineum at the time of delivery or to further numb the perineum. A pudendal block is done by injecting numbing medicine through the vaginal wall into a nerve in the pelvis. Epidural Analgesia Epidural analgesia is given through a flexible IV catheter that is inserted into the lower back. Numbing medicine is delivered continuously to the area near your spinal column nerves (epidural space). After having this type of analgesia, you may be able to move your legs but you most likely will not be able to walk. Depending on the amount of medicine given, you may lose all feeling in the lower half of your body, or you may  retain some level of sensation, including the urge to push. Epidural analgesia can be used to provide pain relief for a vaginal birth. Spinal Block A spinal block is similar to epidural analgesia, but the medicine is injected into the spinal fluid instead of the epidural space. A spinal block is only  given once. It starts to relieve pain quickly, but the pain relief lasts only 1-6 hours. Spinal blocks can be used for cesarean deliveries. Combined Spinal-Epidural (CSE) Block A CSE block combines the effects of a spinal block and epidural analgesia. The spinal block works quickly to block all pain. The epidural analgesia provides continuous pain relief, even after the effects of the spinal block have worn off. This information is not intended to replace advice given to you by your health care provider. Make sure you discuss any questions you have with your health care provider. Document Released: 08/31/2008 Document Revised: 10/22/2015 Document Reviewed: 10/06/2015 Elsevier Interactive Patient Education  2018 [employer]. [facility]  [address] [place], [place], [place] [address]  Phone: [phone]   [facility] (second location)  [address] [place], [place] [address]  Phone: [phone]   [facility]) [address] [place], [place], [place] [address] Phone: [phone]   [facility]  [address], [place], [place] [address]  Phone: [phone] OB PATIENTS CAN EXPECT   Confirmation of pregnancy and ultrasound ordered if medically indicated-[redacted] weeks gestation  New OB (NOB) intake with nurse and New OB (NOB) labs- [redacted] weeks gestation  New OB (NOB) physical examination with provider- 11/[redacted] weeks gestation  Flu vaccine-[redacted] weeks gestation  Anatomy scan-[redacted] weeks gestation  Glucose tolerance test, blood work to test for anemia, T-dap vaccine-[redacted] weeks gestation  Vaginal swabs/cultures-STD/Group B strep-[redacted] weeks gestation  Appointments every 4 weeks until 28 weeks  Every 2 weeks from 28 weeks until 36 weeks  Weekly visits from 36 weeks until delivery  Second Trimester of Pregnancy The second trimester is from week 13 through week 28, month 4 through 6. This is often the time in  pregnancy that you feel your best. Often times, morning sickness has lessened or quit. You may have more energy, and you may get hungry more often. Your unborn baby (fetus) is growing rapidly. At the end of the sixth month, he or she is about 9 inches long and weighs about 1 pounds. You will likely feel the baby move (quickening) between 18 and 20 weeks of pregnancy. Follow these instructions at home:  Avoid all smoking, herbs, and alcohol. Avoid drugs not approved by your doctor.  Do not use any tobacco products, including cigarettes, chewing tobacco, and electronic cigarettes. If you need help quitting, ask your doctor. You may get counseling or other support to help you quit.  Only take medicine as told by your doctor. Some medicines are safe and some are not during pregnancy.  Exercise only as told by your doctor. Stop exercising if you start having cramps.  Eat regular, healthy meals.  Wear a good support bra if your breasts are tender.  Do not use hot tubs, steam rooms, or saunas.  Wear your seat belt when driving.  Avoid raw meat, uncooked cheese, and liter boxes and soil used by cats.  Take your prenatal vitamins.  Take 1500-2000 milligrams of calcium daily starting at the 20th week of pregnancy until you deliver your baby.  Try taking medicine that helps you poop (stool softener) as needed, and  if your doctor approves. Eat more fiber by eating fresh fruit, vegetables, and whole grains. Drink enough fluids to keep your pee (urine) clear or pale yellow.  Take warm water baths (sitz baths) to soothe pain or discomfort caused by hemorrhoids. Use hemorrhoid cream if your doctor approves.  If you have puffy, bulging veins (varicose veins), wear support hose. Raise (elevate) your feet for 15 minutes, 3-4 times a day. Limit salt in your diet.  Avoid heavy lifting, wear low heals, and sit up straight.  Rest with your legs raised if you have leg cramps or low back pain.  Visit your  dentist if you have not gone during your pregnancy. Use a soft toothbrush to brush your teeth. Be gentle when you floss.  You can have sex (intercourse) unless your doctor tells you not to.  Go to your doctor visits. Get help if:  You feel dizzy.  You have mild cramps or pressure in your lower belly (abdomen).  You have a nagging pain in your belly area.  You continue to feel sick to your stomach (nauseous), throw up (vomit), or have watery poop (diarrhea).  You have bad smelling fluid coming from your vagina.  You have pain with peeing (urination). Get help right away if:  You have a fever.  You are leaking fluid from your vagina.  You have spotting or bleeding from your vagina.  You have severe belly cramping or pain.  You lose or gain weight rapidly.  You have trouble catching your breath and have chest pain.  You notice sudden or extreme puffiness (swelling) of your face, hands, ankles, feet, or legs.  You have not felt the baby move in over an hour.  You have severe headaches that do not go away with medicine.  You have vision changes. This information is not intended to replace advice given to you by your health care provider. Make sure you discuss any questions you have with your health care provider. Document Released: 08/09/2009 Document Revised: 10/21/2015 Document Reviewed: 07/16/2012 Elsevier Interactive Patient Education  2017 [employer]

## 2018-01-05 NOTE — Progress Notes (Signed)
ROB-Doing well. Anticipatory guidance regarding course of prenatal care. Desires epidural with FOB and mother as labor support. Plans breastfeeding. Third trimester education; see AVS. [employer] pamphlet, childbirth class schedule, and birth control after baby booklet given. Reviewed red flag symptoms and when to call. RTC x 4 weeks for 28 week labs and ROB or sooner if needed.

## 2018-01-30 ENCOUNTER — Encounter: Payer: Self-pay | Admitting: Certified Nurse Midwife

## 2018-01-30 ENCOUNTER — Other Ambulatory Visit: Payer: Medicaid Other

## 2018-01-30 ENCOUNTER — Ambulatory Visit (INDEPENDENT_AMBULATORY_CARE_PROVIDER_SITE_OTHER): Payer: Medicaid Other | Admitting: Certified Nurse Midwife

## 2018-01-30 VITALS — BP 93/56 | HR 93 | Wt 173.6 lb

## 2018-01-30 DIAGNOSIS — Z3492 Encounter for supervision of normal pregnancy, unspecified, second trimester: Secondary | ICD-10-CM

## 2018-01-30 LAB — POCT URINALYSIS DIPSTICK OB
BILIRUBIN UA: NEGATIVE
GLUCOSE, UA: NEGATIVE
Ketones, UA: NEGATIVE
LEUKOCYTES UA: NEGATIVE
Nitrite, UA: NEGATIVE
RBC UA: NEGATIVE
Spec Grav, UA: 1.01 (ref 1.010–1.025)
Urobilinogen, UA: 0.2 E.U./dL
pH, UA: 6.5 (ref 5.0–8.0)

## 2018-01-30 MED ORDER — TETANUS-DIPHTH-ACELL PERTUSSIS 5-2.5-18.5 LF-MCG/0.5 IM SUSP
0.5000 mL | Freq: Once | INTRAMUSCULAR | Status: AC
  Administered 2018-01-30: 0.5 mL via INTRAMUSCULAR

## 2018-01-30 MED ORDER — TETANUS-DIPHTH-ACELL PERTUSSIS 5-2.5-18.5 LF-MCG/0.5 IM SUSP: 0.5000 mL | Freq: Once | INTRAMUSCULAR | Status: DC

## 2018-01-30 NOTE — Progress Notes (Signed)
ROB doing well. TDAP/RPR/CBC/BTC/ 1 hr glucose today. She is undecided on birth control at this time.Declines flu vaccine. Feels good fetal movement. Will follow up with lab results .ROB in 2 wks.   Doreene Burke, CNM

## 2018-01-30 NOTE — Patient Instructions (Signed)

## 2018-01-31 LAB — CBC
HEMOGLOBIN: 9.1 g/dL — AB (ref 11.1–15.9)
Hematocrit: 29 % — ABNORMAL LOW (ref 34.0–46.6)
MCH: 23 pg — AB (ref 26.6–33.0)
MCHC: 31.4 g/dL — ABNORMAL LOW (ref 31.5–35.7)
MCV: 73 fL — ABNORMAL LOW (ref 79–97)
PLATELETS: 201 10*3/uL (ref 150–450)
RBC: 3.95 x10E6/uL (ref 3.77–5.28)
RDW: 16 % — ABNORMAL HIGH (ref 12.3–15.4)
WBC: 5.8 10*3/uL (ref 3.4–10.8)

## 2018-01-31 LAB — RPR: RPR: NONREACTIVE

## 2018-01-31 LAB — GLUCOSE, 1 HOUR GESTATIONAL: GESTATIONAL DIABETES SCREEN: 86 mg/dL (ref 65–139)

## 2018-02-13 ENCOUNTER — Ambulatory Visit (INDEPENDENT_AMBULATORY_CARE_PROVIDER_SITE_OTHER): Payer: Medicaid Other | Admitting: Obstetrics and Gynecology

## 2018-02-13 VITALS — BP 86/54 | HR 95 | Wt 177.0 lb

## 2018-02-13 DIAGNOSIS — Z3492 Encounter for supervision of normal pregnancy, unspecified, second trimester: Secondary | ICD-10-CM | POA: Diagnosis not present

## 2018-02-13 LAB — POCT URINALYSIS DIPSTICK OB
Bilirubin, UA: NEGATIVE
Glucose, UA: NEGATIVE
Ketones, UA: NEGATIVE
LEUKOCYTES UA: NEGATIVE
NITRITE UA: NEGATIVE
PROTEIN: NEGATIVE
RBC UA: NEGATIVE
SPEC GRAV UA: 1.015 (ref 1.010–1.025)
Urobilinogen, UA: 0.2 E.U./dL
pH, UA: 6.5 (ref 5.0–8.0)

## 2018-02-13 NOTE — Patient Instructions (Signed)
Iron-Rich Diet Iron is a mineral that helps your body to produce hemoglobin. Hemoglobin is a protein in your red blood cells that carries oxygen to your body's tissues. Eating too little iron may cause you to feel weak and tired, and it can increase your risk for infection. Eating enough iron is necessary for your body's metabolism, muscle function, and nervous system. Iron is naturally found in many foods. It can also be added to foods or fortified in foods. There are two types of dietary iron:  Heme iron. Heme iron is absorbed by the body more easily than nonheme iron. Heme iron is found in meat, poultry, and fish.  Nonheme iron. Nonheme iron is found in dietary supplements, iron-fortified grains, beans, and vegetables.  You may need to follow an iron-rich diet if:  You have been diagnosed with iron deficiency or iron-deficiency anemia.  You have a condition that prevents you from absorbing dietary iron, such as: ? Infection in your intestines. ? Celiac disease. This involves long-lasting (chronic) inflammation of your intestines.  You do not eat enough iron.  You eat a diet that is high in foods that impair iron absorption.  You have lost a lot of blood.  You have heavy bleeding during your menstrual cycle.  You are pregnant.  What is my plan? Your health care provider may help you to determine how much iron you need per day based on your condition. Generally, when a person consumes sufficient amounts of iron in the diet, the following iron needs are met:  Men. ? 14-18 years old: 11 mg per day. ? 19-50 years old: 8 mg per day.  Women. ? 14-18 years old: 15 mg per day. ? 19-50 years old: 18 mg per day. ? Over 50 years old: 8 mg per day. ? Pregnant women: 27 mg per day. ? Breastfeeding women: 9 mg per day.  What do I need to know about an iron-rich diet?  Eat fresh fruits and vegetables that are high in vitamin C along with foods that are high in iron. This will help  increase the amount of iron that your body absorbs from food, especially with foods containing nonheme iron. Foods that are high in vitamin C include oranges, peppers, tomatoes, and mango.  Take iron supplements only as directed by your health care provider. Overdose of iron can be life-threatening. If you were prescribed iron supplements, take them with orange juice or a vitamin C supplement.  Cook foods in pots and pans that are made from iron.  Eat nonheme iron-containing foods alongside foods that are high in heme iron. This helps to improve your iron absorption.  Certain foods and drinks contain compounds that impair iron absorption. Avoid eating these foods in the same meal as iron-rich foods or with iron supplements. These include: ? Coffee, black tea, and red wine. ? Milk, dairy products, and foods that are high in calcium. ? Beans, soybeans, and peas. ? Whole grains.  When eating foods that contain both nonheme iron and compounds that impair iron absorption, follow these tips to absorb iron better. ? Soak beans overnight before cooking. ? Soak whole grains overnight and drain them before using. ? Ferment flours before baking, such as using yeast in bread dough. What foods can I eat? Grains Iron-fortified breakfast cereal. Iron-fortified whole-wheat bread. Enriched rice. Sprouted grains. Vegetables Spinach. Potatoes with skin. Green peas. Broccoli. Red and green bell peppers. Fermented vegetables. Fruits Prunes. Raisins. Oranges. Strawberries. Mango. Grapefruit. Meats and Other Protein Sources   Beef liver. Oysters. Beef. Shrimp. [place]. Chicken. [place]. Sardines. Chickpeas. Nuts. Tofu. Beverages Tomato juice. Fresh orange juice. Prune juice. Hibiscus tea. Fortified instant breakfast shakes. Condiments Tahini. Fermented soy sauce. Sweets and Desserts Black-strap molasses. Other Wheat germ. The items listed above may not be a complete list of recommended foods or beverages.  Contact your dietitian for more options. What foods are not recommended? Grains Whole grains. Bran cereal. Bran flour. Oats. Vegetables Artichokes. Brussels sprouts. Kale. Fruits Blueberries. Raspberries. Strawberries. Figs. Meats and Other Protein Sources Soybeans. Products made from soy protein. Dairy Milk. Cream. Cheese. Yogurt. Cottage cheese. Beverages Coffee. Black tea. Red wine. Sweets and Desserts Cocoa. Chocolate. Ice cream. Other Basil. Oregano. Parsley. The items listed above may not be a complete list of foods and beverages to avoid. Contact your dietitian for more information. This information is not intended to replace advice given to you by your health care provider. Make sure you discuss any questions you have with your health care provider. Document Released: 12/27/2004 Document Revised: 12/03/2015 Document Reviewed: 12/10/2013 Elsevier Interactive Patient Education  2018 [employer]. Anemia Anemia is a condition in which you do not have enough red blood cells or hemoglobin. Hemoglobin is a substance in red blood cells that carries oxygen. When you do not have enough red blood cells or hemoglobin (are anemic), your body cannot get enough oxygen and your organs may not work properly. As a result, you may feel very tired or have other problems. What are the causes? Common causes of anemia include:  Excessive bleeding. Anemia can be caused by excessive bleeding inside or outside the body, including bleeding from the intestine or from periods in women.  Poor nutrition.  Long-lasting (chronic) kidney, thyroid, and liver disease.  Bone marrow disorders.  Cancer and treatments for cancer.  HIV (human immunodeficiency virus) and AIDS (acquired immunodeficiency syndrome).  Treatments for HIV and AIDS.  Spleen problems.  Blood disorders.  Infections, medicines, and autoimmune disorders that destroy red blood cells.  What are the signs or symptoms? Symptoms of  this condition include:  Minor weakness.  Dizziness.  Headache.  Feeling heartbeats that are irregular or faster than normal (palpitations).  Shortness of breath, especially with exercise.  Paleness.  Cold sensitivity.  Indigestion.  Nausea.  Difficulty sleeping.  Difficulty concentrating.  Symptoms may occur suddenly or develop slowly. If your anemia is mild, you may not have symptoms. How is this diagnosed? This condition is diagnosed based on:  Blood tests.  Your medical history.  A physical exam.  Bone marrow biopsy.  Your health care provider may also check your stool (feces) for blood and may do additional testing to look for the cause of your bleeding. You may also have other tests, including:  Imaging tests, such as a CT scan or MRI.  Endoscopy.  Colonoscopy.  How is this treated? Treatment for this condition depends on the cause. If you continue to lose a lot of blood, you may need to be treated at a hospital. Treatment may include:  Taking supplements of iron, vitamin V95, or folic acid.  Taking a hormone medicine (erythropoietin) that can help to stimulate red blood cell growth.  Having a blood transfusion. This may be needed if you lose a lot of blood.  Making changes to your diet.  Having surgery to remove your spleen.  Follow these instructions at home:  Take over-the-counter and prescription medicines only as told by your health care provider.  Take supplements only as told by your  health care provider.  Follow any diet instructions that you were given.  Keep all follow-up visits as told by your health care provider. This is important. Contact a health care provider if:  You develop new bleeding anywhere in the body. Get help right away if:  You are very weak.  You are short of breath.  You have pain in your abdomen or chest.  You are dizzy or feel faint.  You have trouble concentrating.  You have bloody or black, tarry  stools.  You vomit repeatedly or you vomit up blood. Summary  Anemia is a condition in which you do not have enough red blood cells or enough of a substance in your red blood cells that carries oxygen (hemoglobin).  Symptoms may occur suddenly or develop slowly.  If your anemia is mild, you may not have symptoms.  This condition is diagnosed with blood tests as well as a medical history and physical exam. Other tests may be needed.  Treatment for this condition depends on the cause of the anemia. This information is not intended to replace advice given to you by your health care provider. Make sure you discuss any questions you have with your health care provider. Document Released: 06/22/2004 Document Revised: 06/16/2016 Document Reviewed: 06/16/2016 Elsevier Interactive Patient Education  [employer].

## 2018-02-13 NOTE — Progress Notes (Signed)
Pt presents today for ROB. Pt complains of vaginal irritation and itching.

## 2018-02-13 NOTE — Progress Notes (Signed)
ROB- discussed anemia and iron rich foods. Ironspan samples given, yeast infection has returned- terazol Rx sent in for use. Thick white yeast discharge noted on exam with mild vulvar errythema

## 2018-02-15 ENCOUNTER — Other Ambulatory Visit: Payer: Self-pay | Admitting: Obstetrics and Gynecology

## 2018-02-15 MED ORDER — TERCONAZOLE 0.4 % VA CREA: 1.0000 | TOPICAL_CREAM | Freq: Every day | VAGINAL | 2 refills | Status: DC

## 2018-02-28 ENCOUNTER — Ambulatory Visit (INDEPENDENT_AMBULATORY_CARE_PROVIDER_SITE_OTHER): Payer: Medicaid Other | Admitting: Certified Nurse Midwife

## 2018-02-28 VITALS — BP 97/59 | HR 86 | Wt 180.0 lb

## 2018-02-28 DIAGNOSIS — O99013 Anemia complicating pregnancy, third trimester: Secondary | ICD-10-CM

## 2018-02-28 DIAGNOSIS — Z3493 Encounter for supervision of normal pregnancy, unspecified, third trimester: Secondary | ICD-10-CM

## 2018-02-28 LAB — POCT URINALYSIS DIPSTICK OB
Bilirubin, UA: NEGATIVE
Blood, UA: NEGATIVE
Glucose, UA: NEGATIVE
KETONES UA: NEGATIVE
LEUKOCYTES UA: NEGATIVE
NITRITE UA: NEGATIVE
PH UA: 6.5 (ref 5.0–8.0)
PROTEIN: NEGATIVE
SPEC GRAV UA: 1.015 (ref 1.010–1.025)
UROBILINOGEN UA: 0.2 U/dL

## 2018-02-28 NOTE — Patient Instructions (Signed)
Vaginal Delivery Vaginal delivery means that you will give birth by pushing your baby out of your birth canal (vagina). A team of health care providers will help you before, during, and after vaginal delivery. Birth experiences are unique for every woman and every pregnancy, and birth experiences vary depending on where you choose to give birth. What should I do to prepare for my baby's birth? Before your baby is born, it is important to talk with your health care provider about:  Your labor and delivery preferences. These may include: ? Medicines that you may be given. ? How you will manage your pain. This might include non-medical pain relief techniques or injectable pain relief such as epidural analgesia. ? How you and your baby will be monitored during labor and delivery. ? Who may be in the labor and delivery room with you. ? Your feelings about surgical delivery of your baby (cesarean delivery, or C-section) if this becomes necessary. ? Your feelings about receiving donated blood through an IV tube (blood transfusion) if this becomes necessary.  Whether you are able: ? To take pictures or videos of the birth. ? To eat during labor and delivery. ? To move around, walk, or change positions during labor and delivery.  What to expect after your baby is born, such as: ? Whether delayed umbilical cord clamping and cutting is offered. ? Who will care for your baby right after birth. ? Medicines or tests that may be recommended for your baby. ? Whether breastfeeding is supported in your hospital or birth center. ? How long you will be in the hospital or birth center.  How any medical conditions you have may affect your baby or your labor and delivery experience.  To prepare for your baby's birth, you should also:  Attend all of your health care visits before delivery (prenatal visits) as recommended by your health care provider. This is important.  Prepare your home for your baby's  arrival. Make sure that you have: ? Diapers. ? Baby clothing. ? Feeding equipment. ? Safe sleeping arrangements for you and your baby.  Install a car seat in your vehicle. Have your car seat checked by a certified car seat installer to make sure that it is installed safely.  Think about who will help you with your new baby at home for at least the first several weeks after delivery.  What can I expect when I arrive at the birth center or hospital? Once you are in labor and have been admitted into the hospital or birth center, your health care provider may:  Review your pregnancy history and any concerns you have.  Insert an IV tube into one of your veins. This is used to give you fluids and medicines.  Check your blood pressure, pulse, temperature, and heart rate (vital signs).  Check whether your bag of water (amniotic sac) has broken (ruptured).  Talk with you about your birth plan and discuss pain control options.  Monitoring Your health care provider may monitor your contractions (uterine monitoring) and your baby's heart rate (fetal monitoring). You may need to be monitored:  Often, but not continuously (intermittently).  All the time or for long periods at a time (continuously). Continuous monitoring may be needed if: ? You are taking certain medicines, such as medicine to relieve pain or make your contractions stronger. ? You have pregnancy or labor complications.  Monitoring may be done by:  Placing a special stethoscope or a handheld monitoring device on your abdomen to   check your baby's heartbeat, and feeling your abdomen for contractions. This method of monitoring does not continuously record your baby's heartbeat or your contractions.  Placing monitors on your abdomen (external monitors) to record your baby's heartbeat and the frequency and length of contractions. You may not have to wear external monitors all the time.  Placing monitors inside of your uterus  (internal monitors) to record your baby's heartbeat and the frequency, length, and strength of your contractions. ? Your health care provider may use internal monitors if he or she needs more information about the strength of your contractions or your baby's heart rate. ? Internal monitors are put in place by passing a thin, flexible wire through your vagina and into your uterus. Depending on the type of monitor, it may remain in your uterus or on your baby's head until birth. ? Your health care provider will discuss the benefits and risks of internal monitoring with you and will ask for your permission before inserting the monitors.  Telemetry. This is a type of continuous monitoring that can be done with external or internal monitors. Instead of having to stay in bed, you are able to move around during telemetry. Ask your health care provider if telemetry is an option for you.  Physical exam Your health care provider may perform a physical exam. This may include:  Checking whether your baby is positioned: ? With the head toward your vagina (head-down). This is most common. ? With the head toward the top of your uterus (head-up or breech). If your baby is in a breech position, your health care provider may try to turn your baby to a head-down position so you can deliver vaginally. If it does not seem that your baby can be born vaginally, your provider may recommend surgery to deliver your baby. In rare cases, you may be able to deliver vaginally if your baby is head-up (breech delivery). ? Lying sideways (transverse). Babies that are lying sideways cannot be delivered vaginally.  Checking your cervix to determine: ? Whether it is thinning out (effacing). ? Whether it is opening up (dilating). ? How low your baby has moved into your birth canal.  What are the three stages of labor and delivery?  Normal labor and delivery is divided into the following three stages: Stage 1  Stage 1 is the  longest stage of labor, and it can last for hours or days. Stage 1 includes: ? Early labor. This is when contractions may be irregular, or regular and mild. Generally, early labor contractions are more than 10 minutes apart. ? Active labor. This is when contractions get longer, more regular, more frequent, and more intense. ? The transition phase. This is when contractions happen very close together, are very intense, and may last longer than during any other part of labor.  Contractions generally feel mild, infrequent, and irregular at first. They get stronger, more frequent (about every 2-3 minutes), and more regular as you progress from early labor through active labor and transition.  Many women progress through stage 1 naturally, but you may need help to continue making progress. If this happens, your health care provider may talk with you about: ? Rupturing your amniotic sac if it has not ruptured yet. ? Giving you medicine to help make your contractions stronger and more frequent.  Stage 1 ends when your cervix is completely dilated to 4 inches (10 cm) and completely effaced. This happens at the end of the transition phase. Stage 2  Once   your cervix is completely effaced and dilated to 4 inches (10 cm), you may start to feel an urge to push. It is common for the body to naturally take a rest before feeling the urge to push, especially if you received an epidural or certain other pain medicines. This rest period may last for up to 1-2 hours, depending on your unique labor experience.  During stage 2, contractions are generally less painful, because pushing helps relieve contraction pain. Instead of contraction pain, you may feel stretching and burning pain, especially when the widest part of your baby's head passes through the vaginal opening (crowning).  Your health care provider will closely monitor your pushing progress and your baby's progress through the vagina during stage 2.  Your  health care provider may massage the area of skin between your vaginal opening and anus (perineum) or apply warm compresses to your perineum. This helps it stretch as the baby's head starts to crown, which can help prevent perineal tearing. ? In some cases, an incision may be made in your perineum (episiotomy) to allow the baby to pass through the vaginal opening. An episiotomy helps to make the opening of the vagina larger to allow more room for the baby to fit through.  It is very important to breathe and focus so your health care provider can control the delivery of your baby's head. Your health care provider may have you decrease the intensity of your pushing, to help prevent perineal tearing.  After delivery of your baby's head, the shoulders and the rest of the body generally deliver very quickly and without difficulty.  Once your baby is delivered, the umbilical cord may be cut right away, or this may be delayed for 1-2 minutes, depending on your baby's health. This may vary among health care providers, hospitals, and birth centers.  If you and your baby are healthy enough, your baby may be placed on your chest or abdomen to help maintain the baby's temperature and to help you bond with each other. Some mothers and babies start breastfeeding at this time. Your health care team will dry your baby and help keep your baby warm during this time.  Your baby may need immediate care if he or she: ? Showed signs of distress during labor. ? Has a medical condition. ? Was born too early (prematurely). ? Had a bowel movement before birth (meconium). ? Shows signs of difficulty transitioning from being inside the uterus to being outside of the uterus. If you are planning to breastfeed, your health care team will help you begin a feeding. Stage 3  The third stage of labor starts immediately after the birth of your baby and ends after you deliver the placenta. The placenta is an organ that develops  during pregnancy to provide oxygen and nutrients to your baby in the womb.  Delivering the placenta may require some pushing, and you may have mild contractions. Breastfeeding can stimulate contractions to help you deliver the placenta.  After the placenta is delivered, your uterus should tighten (contract) and become firm. This helps to stop bleeding in your uterus. To help your uterus contract and to control bleeding, your health care provider may: ? Give you medicine by injection, through an IV tube, by mouth, or through your rectum (rectally). ? Massage your abdomen or perform a vaginal exam to remove any blood clots that are left in your uterus. ? Empty your bladder by placing a thin, flexible tube (catheter) into your bladder. ? Encourage   you to breastfeed your baby. After labor is over, you and your baby will be monitored closely to ensure that you are both healthy until you are ready to go home. Your health care team will teach you how to care for yourself and your baby. This information is not intended to replace advice given to you by your health care provider. Make sure you discuss any questions you have with your health care provider. Document Released: 02/22/2008 Document Revised: 12/03/2015 Document Reviewed: 05/30/2015 Elsevier Interactive Patient Education  2018 [employer]  

## 2018-03-01 ENCOUNTER — Other Ambulatory Visit: Payer: Medicaid Other

## 2018-03-01 ENCOUNTER — Ambulatory Visit (INDEPENDENT_AMBULATORY_CARE_PROVIDER_SITE_OTHER): Payer: Medicaid Other | Admitting: Certified Nurse Midwife

## 2018-03-01 VITALS — BP 108/66 | HR 98 | Ht 67.0 in | Wt 179.5 lb

## 2018-03-01 DIAGNOSIS — O99013 Anemia complicating pregnancy, third trimester: Secondary | ICD-10-CM | POA: Diagnosis not present

## 2018-03-01 DIAGNOSIS — Z3A32 32 weeks gestation of pregnancy: Secondary | ICD-10-CM

## 2018-03-01 LAB — CBC
Hematocrit: 27.5 % — ABNORMAL LOW (ref 34.0–46.6)
Hemoglobin: 8.3 g/dL — ABNORMAL LOW (ref 11.1–15.9)
MCH: 22.9 pg — ABNORMAL LOW (ref 26.6–33.0)
MCHC: 30.2 g/dL — ABNORMAL LOW (ref 31.5–35.7)
MCV: 76 fL — AB (ref 79–97)
PLATELETS: 163 10*3/uL (ref 150–450)
RBC: 3.62 x10E6/uL — ABNORMAL LOW (ref 3.77–5.28)
RDW: 14.9 % (ref 12.3–15.4)
WBC: 9.3 10*3/uL (ref 3.4–10.8)

## 2018-03-01 MED ORDER — CYANOCOBALAMIN 1000 MCG/ML IJ SOLN
1000.0000 ug | Freq: Once | INTRAMUSCULAR | Status: AC
  Administered 2018-03-01: 1000 ug via INTRAMUSCULAR

## 2018-03-01 NOTE — Progress Notes (Signed)
ROB- Pt is present today for B-12 injection.  BP 108/66   Pulse 98   Ht 5\' 7"  (1.702 m)   Wt 179 lb 8 oz (81.4 kg)   LMP 07/08/2017 (Approximate)   BMI 28.11 kg/m

## 2018-03-01 NOTE — Progress Notes (Signed)
ROB-Reports pelvic pressure. Discussed home treatment measures including use of abdominal support. Taking iron supplements for anemia, will repeat CBC today. Anticipatory guidance regarding course of prenatal care. Reviewed red flag symptoms and when to call. RTC x 2 weeks for ROB or sooner if needed.

## 2018-03-02 LAB — FOLATE: Folate: >20 ng/mL (ref >3.0)

## 2018-03-02 LAB — VITAMIN B12

## 2018-03-03 NOTE — Progress Notes (Signed)
I have reviewed the record and concur with patient management and plan of care.    Stakes Michelle Knute Mazzuca, CNM [facility] 

## 2018-03-05 ENCOUNTER — Encounter: Payer: Self-pay | Admitting: Certified Nurse Midwife

## 2018-03-05 ENCOUNTER — Other Ambulatory Visit: Payer: Self-pay | Admitting: Certified Nurse Midwife

## 2018-03-05 DIAGNOSIS — O99019 Anemia complicating pregnancy, unspecified trimester: Secondary | ICD-10-CM

## 2018-03-13 ENCOUNTER — Encounter: Payer: Self-pay | Admitting: Certified Nurse Midwife

## 2018-03-13 ENCOUNTER — Other Ambulatory Visit: Payer: Self-pay

## 2018-03-13 ENCOUNTER — Inpatient Hospital Stay: Payer: Medicaid Other

## 2018-03-13 ENCOUNTER — Ambulatory Visit (INDEPENDENT_AMBULATORY_CARE_PROVIDER_SITE_OTHER): Payer: Medicaid Other | Admitting: Certified Nurse Midwife

## 2018-03-13 ENCOUNTER — Encounter: Payer: Self-pay | Admitting: Oncology

## 2018-03-13 ENCOUNTER — Inpatient Hospital Stay: Payer: Medicaid Other | Attending: Oncology | Admitting: Oncology

## 2018-03-13 VITALS — BP 107/68 | HR 90 | Temp 96.6°F | Resp 18 | Ht 67.5 in | Wt 179.5 lb

## 2018-03-13 VITALS — BP 96/57 | HR 92 | Wt 176.4 lb

## 2018-03-13 DIAGNOSIS — O0913 Supervision of pregnancy with history of ectopic or molar pregnancy, third trimester: Secondary | ICD-10-CM | POA: Diagnosis present

## 2018-03-13 DIAGNOSIS — D508 Other iron deficiency anemias: Secondary | ICD-10-CM | POA: Diagnosis present

## 2018-03-13 DIAGNOSIS — O99013 Anemia complicating pregnancy, third trimester: Secondary | ICD-10-CM

## 2018-03-13 DIAGNOSIS — D509 Iron deficiency anemia, unspecified: Secondary | ICD-10-CM

## 2018-03-13 DIAGNOSIS — Z3A34 34 weeks gestation of pregnancy: Secondary | ICD-10-CM

## 2018-03-13 LAB — POCT URINALYSIS DIPSTICK OB
BILIRUBIN UA: NEGATIVE
Blood, UA: NEGATIVE
Glucose, UA: NEGATIVE
Leukocytes, UA: NEGATIVE
Nitrite, UA: NEGATIVE
PH UA: 6 (ref 5.0–8.0)
Spec Grav, UA: 1.02 (ref 1.010–1.025)
UROBILINOGEN UA: 0.2 U/dL

## 2018-03-13 LAB — CBC WITH DIFFERENTIAL/PLATELET
Abs Immature Granulocytes: 0.07 10*3/uL (ref 0.00–0.07)
Basophils Absolute: 0 10*3/uL (ref 0.0–0.1)
Basophils Relative: 1 %
EOS ABS: 0.1 10*3/uL (ref 0.0–0.5)
EOS PCT: 1 %
HEMATOCRIT: 30 % — AB (ref 36.0–46.0)
Hemoglobin: 9.3 g/dL — ABNORMAL LOW (ref 12.0–15.0)
Immature Granulocytes: 1 %
LYMPHS ABS: 1.9 10*3/uL (ref 0.7–4.0)
Lymphocytes Relative: 26 %
MCH: 22.9 pg — ABNORMAL LOW (ref 26.0–34.0)
MCHC: 31 g/dL (ref 30.0–36.0)
MCV: 73.9 fL — AB (ref 80.0–100.0)
Monocytes Absolute: 0.6 10*3/uL (ref 0.1–1.0)
Monocytes Relative: 8 %
Neutro Abs: 4.6 10*3/uL (ref 1.7–7.7)
Neutrophils Relative %: 63 %
Platelets: 190 10*3/uL (ref 150–400)
RBC: 4.06 MIL/uL (ref 3.87–5.11)
RDW: 15.1 % (ref 11.5–15.5)
WBC: 7.3 10*3/uL (ref 4.0–10.5)
nRBC: 0 % (ref 0.0–0.2)

## 2018-03-13 LAB — IRON AND TIBC
Iron: 61 ug/dL (ref 28–170)
SATURATION RATIOS: 13 % (ref 10.4–31.8)
TIBC: 468 ug/dL — AB (ref 250–450)
UIBC: 407 ug/dL

## 2018-03-13 LAB — TECHNOLOGIST SMEAR REVIEW: Tech Review: NORMAL

## 2018-03-13 LAB — FERRITIN: Ferritin: 10 ng/mL — ABNORMAL LOW (ref 11–307)

## 2018-03-13 NOTE — Progress Notes (Signed)
Patient here for initial visit. Pt states she feels tired all the time and when she walks she feels very "winded." Pt had an episode of feeling faint.

## 2018-03-13 NOTE — Progress Notes (Signed)
ROB doing well. Pt seen by hematology today. She had had blood work completed at that appointment. She states she does not have a follow up appointment until December. Hemotology note not in chart at this time. Will recheck for note for recommendations. Will repeat cbc at next appointment. Discussed gbs at next visit patient verbalizes and agrees to plan. Follow up 2 wks.   Doreene Burke, CNM

## 2018-03-13 NOTE — Patient Instructions (Signed)
Group B Streptococcus Infection During Pregnancy Group B Streptococcus (GBS) is a type of bacteria (Streptococcus agalactiae) that is often found in healthy people, commonly in the rectum, vagina, and intestines. In people who are healthy and not pregnant, the bacteria rarely cause serious illness or complications. However, women who test positive for GBS during pregnancy can pass the bacteria to their baby during childbirth, which can cause serious infection in the baby after birth. Women with GBS may also have infections during their pregnancy or immediately after childbirth, such as such as urinary tract infections (UTIs) or infections of the uterus (uterine infections). Having GBS also increases a woman's risk of complications during pregnancy, such as early (preterm) labor or delivery, miscarriage, or stillbirth. Routine testing (screening) for GBS is recommended for all pregnant women. What increases the risk? You may have a higher risk for GBS infection during pregnancy if you had one during a past pregnancy. What are the signs or symptoms? In most cases, GBS infection does not cause symptoms in pregnant women. Signs and symptoms of a possible GBS-related infection may include:  Labor starting before the 37th week of pregnancy.  A UTI or bladder infection, which may cause: ? Fever. ? Pain or burning during urination. ? Frequent urination.  Fever during labor, along with: ? Bad-smelling discharge. ? Uterine tenderness. ? Rapid heartbeat in the mother, baby, or both.  Rare but serious symptoms of a possible GBS-related infection in women include:  Blood infection (septicemia). This may cause fever, chills, or confusion.  Lung infection (pneumonia). This may cause fever, chills, cough, rapid breathing, difficulty breathing, or chest pain.  Bone, joint, skin, or soft tissue infection.  How is this diagnosed? You may be screened for GBS between week 35 and week 37 of your pregnancy. If  you have symptoms of preterm labor, you may be screened earlier. This condition is diagnosed based on lab test results from:  A swab of fluid from the vagina and rectum.  A urine sample.  How is this treated? This condition is treated with antibiotic medicine. When you go into labor, or as soon as your water breaks (your membranes rupture), you will be given antibiotics through an IV tube. Antibiotics will continue until after you give birth. If you are having a cesarean delivery, you do not need antibiotics unless your membranes have already ruptured. Follow these instructions at home:  Take over-the-counter and prescription medicines only as told by your health care provider.  Take your antibiotic medicine as told by your health care provider. Do not stop taking the antibiotic even if you start to feel better.  Keep all pre-birth (prenatal) visits and follow-up visits as told by your health care provider. This is important. Contact a health care provider if:  You have pain or burning when you urinate.  You have to urinate frequently.  You have a fever or chills.  You develop a bad-smelling vaginal discharge. Get help right away if:  Your membranes rupture.  You go into labor.  You have severe pain in your abdomen.  You have difficulty breathing.  You have chest pain. This information is not intended to replace advice given to you by your health care provider. Make sure you discuss any questions you have with your health care provider. Document Released: 08/22/2007 Document Revised: 12/10/2015 Document Reviewed: 12/09/2015 [employer] Interactive Patient Education  2018 [employer]  

## 2018-03-14 NOTE — Progress Notes (Signed)
Hematology/Oncology Consult note [facility] Telephone:([phone] [phone]   Patient Care Team: Patient, No Pcp Per as PCP - General (General Practice)  REFERRING PROVIDER:  OBGYN midwife Serafina Royals  CHIEF COMPLAINTS/REASON FOR VISIT:  Evaluation of  Anemia in pregnancy  HISTORY OF PRESENTING ILLNESS:  Allison [name] is a  27 y.o.  female with PMH listed below who was referred to me for evaluation of anemia in pregnancy. Patient currently in third trimester pregnancy with her second child. Reviewed patient's recent labs that was done.  CBC on 03/27/2018 revealed anemia with hemoglobin of 8.3, MCV 76, normal platelet 1 63,000, WBC 9.3.  Reviewed patient's previous labs, anemia is chronic onset , duration is since at least 2015, chronic microcytic anemia. Patient also reports that she also had iron deficiency anemia during her first pregnancy as well.  No aggravating or improving factors.  Associated signs and symptoms: Patient reports fatigue, mild shortness of breath with exertion. Denies weight loss, easy bruising, hematochezia, hemoptysis, hematuria. Context: History of GI bleeding:                History of Chronic kidney disease; denies               History of autoimmune disease; denies               History of hemolytic anemia.  Denies               Last colonoscopy: Denies               History of heavy menstrual period: Yes  Patient has been taking iron supplements ferrous sulfate 325 mg daily with breakfast.  Tolerates well.   Review of Systems  Constitutional: Positive for malaise/fatigue. Negative for chills and fever.  HENT: Negative for nosebleeds and sore throat.   Eyes: Negative for double vision, photophobia and redness.  Respiratory: Negative for cough, shortness of breath and wheezing.   Cardiovascular: Negative for chest pain, palpitations and orthopnea.  Gastrointestinal: Negative for abdominal pain, blood in  stool, nausea and vomiting.  Genitourinary: Negative for dysuria.  Musculoskeletal: Negative for back pain, myalgias and neck pain.  Skin: Negative for itching and rash.  Neurological: Negative for dizziness, tingling and tremors.  Endo/Heme/Allergies: Negative for environmental allergies. Does not bruise/bleed easily.  Psychiatric/Behavioral: Negative for depression.    MEDICAL HISTORY:  Past Medical History:  Diagnosis Date  . Anemia   . Asthma    exercise induced, inhaler use 8 years ago    SURGICAL HISTORY: Past Surgical History:  Procedure Laterality Date  . WISDOM TOOTH EXTRACTION      SOCIAL HISTORY: Social History   Socioeconomic History  . Marital status: Single    Spouse name: Not on file  . Number of children: Not on file  . Years of education: Not on file  . Highest education level: Not on file  Occupational History    Employer: [employer]  Social Needs  . Financial resource strain: Not on file  . Food insecurity:    Worry: Not on file    Inability: Not on file  . Transportation needs:    Medical: Not on file    Non-medical: Not on file  Tobacco Use  . Smoking status: Never Smoker  . Smokeless tobacco: Never Used  Substance and Sexual Activity  . Alcohol use: No  . Drug use: No  . Sexual activity: Yes    Birth control/protection: None  Lifestyle  .  Physical activity:    Days per week: Not on file    Minutes per session: Not on file  . Stress: Not on file  Relationships  . Social connections:    Talks on phone: Not on file    Gets together: Not on file    Attends religious service: Not on file    Active member of club or organization: Not on file    Attends meetings of clubs or organizations: Not on file    Relationship status: Not on file  . Intimate partner violence:    Fear of current or ex partner: Not on file    Emotionally abused: Not on file    Physically abused: Not on file    Forced sexual activity: Not on file  Other Topics  Concern  . Not on file  Social History Narrative  . Not on file    FAMILY HISTORY: Family History  Problem Relation Age of Onset  . Breast cancer Other   . Ovarian cancer Neg Hx   . Colon cancer Neg Hx   . Diabetes Neg Hx     ALLERGIES:  is allergic to dust mite extract and tree extract.  MEDICATIONS:  Current Outpatient Medications  Medication Sig Dispense Refill  . ferrous sulfate 325 (65 FE) MG EC tablet Take 325 mg by mouth daily with breakfast.     . Prenatal Vit-Fe Fumarate-FA (PRENATAL MULTIVITAMIN) TABS tablet Take 1 tablet by mouth daily at 12 noon.    . pyridOXINE (B-6) 50 MG tablet Take 100 mg by mouth daily.      Current Facility-Administered Medications  Medication Dose Route Frequency Provider Last Rate Last Dose  . Tdap (BOOSTRIX) injection 0.5 mL  0.5 mL Intramuscular Once Doreene Burke, CNM         PHYSICAL EXAMINATION: ECOG PERFORMANCE STATUS: 1 - Symptomatic but completely ambulatory Vitals:   03/13/18 1517  BP: 107/68  Pulse: 90  Resp: 18  Temp: (!) 96.6 F (35.9 C)   Filed Weights   03/13/18 1517  Weight: 179 lb 8 oz (81.4 kg)    Physical Exam  Constitutional: She is oriented to person, place, and time. No distress.  HENT:  Head: Normocephalic and atraumatic.  Mouth/Throat: Oropharynx is clear and moist.  Eyes: Pupils are equal, round, and reactive to light. EOM are normal. No scleral icterus.  Pale conjunctivae  Neck: Normal range of motion. Neck supple.  Cardiovascular: Normal rate, regular rhythm and normal heart sounds.  Pulmonary/Chest: Effort normal. No respiratory distress. She has no wheezes.  Abdominal: Soft. Bowel sounds are normal. She exhibits no mass. There is no tenderness.  Gravidas uterus  Musculoskeletal: Normal range of motion. She exhibits no edema or deformity.  Neurological: She is alert and oriented to person, place, and time. No cranial nerve deficit. Coordination normal.  Skin: Skin is warm and dry. No rash  noted. No erythema.  Psychiatric: She has a normal mood and affect. Her behavior is normal. Thought content normal.     LABORATORY DATA:  I have reviewed the data as listed Lab Results  Component Value Date   WBC 7.3 03/13/2018   HGB 9.3 (L) 03/13/2018   HCT 30.0 (L) 03/13/2018   MCV 73.9 (L) 03/13/2018   PLT 190 03/13/2018   No results for input(s): NA, K, CL, CO2, GLUCOSE, BUN, CREATININE, CALCIUM, GFRNONAA, GFRAA, PROT, ALBUMIN, AST, ALT, ALKPHOS, BILITOT, BILIDIR, IBILI in the last 8760 hours. Iron/TIBC/Ferritin/ %Sat    Component Value Date/Time  IRON 61 03/13/2018 1554   TIBC 468 (H) 03/13/2018 1554   FERRITIN 10 (L) 03/13/2018 1554   IRONPCTSAT 13 03/13/2018 1554        ASSESSMENT & PLAN:  1. Microcytic anemia   2. Other iron deficiency anemia   3. Anemia during pregnancy in third trimester     #Labs reviewed and discussed with patient.  She has history of chronic microcytic anemia, not improved with oral iron supplementation. I do not see any recent iron panel.  Differential includes iron deficiency anemia versus underlying hemoglobinopathy. Will check CBC w differential,  iron/TIBC, ferritin, smear We also discussed that if confirmed that she has iron deficiency, I recommend IV Venofer to increase her iron store and improve anemia level. Plan IV iron with Venofer 200mg  weekly x 4 doses. Allergy reactions/infusion reaction including anaphylactic reaction discussed with patient. Other side effects include but not limited to high blood pressure, skin rash, weight gain, leg swelling, etc. Patient voices understanding and willing to proceed.  She is due in November.  Advised patient to follow-up with me in 2 months and repeat iron panel for evaluation of additional IV iron.  Orders Placed This Encounter  Procedures  . CBC with Differential/Platelet    Standing Status:   Future    Number of Occurrences:   1    Standing Expiration Date:   03/14/2019  . Technologist  smear review    Standing Status:   Future    Number of Occurrences:   1    Standing Expiration Date:   03/14/2019  . Iron and TIBC    Standing Status:   Future    Number of Occurrences:   1    Standing Expiration Date:   03/14/2019  . Ferritin    Standing Status:   Future    Number of Occurrences:   1    Standing Expiration Date:   03/14/2019  . CBC with Differential/Platelet    Standing Status:   Future    Standing Expiration Date:   03/14/2019  . Iron and TIBC    Standing Status:   Future    Standing Expiration Date:   03/14/2019    All questions were answered. The patient knows to call the clinic with any problems questions or concerns.  Return of visit: 2 months Thank you for this kind referral and the opportunity to participate in the care of this patient. A copy of today's note is routed to referring provider  Total face to face encounter time for this patient visit was . >50% of the time was  spent in counseling and coordination of care.    Rickard Patience, MD, PhD Hematology Oncology [facility] Pager- [phone] 03/14/2018

## 2018-03-21 ENCOUNTER — Inpatient Hospital Stay: Payer: Medicaid Other

## 2018-03-21 VITALS — BP 106/62 | HR 99 | Temp 98.3°F | Resp 18

## 2018-03-21 DIAGNOSIS — D508 Other iron deficiency anemias: Secondary | ICD-10-CM

## 2018-03-21 MED ORDER — IRON SUCROSE 20 MG/ML IV SOLN
200.0000 mg | Freq: Once | INTRAVENOUS | Status: AC
  Administered 2018-03-21: 200 mg via INTRAVENOUS
  Filled 2018-03-21: qty 10

## 2018-03-21 MED ORDER — SODIUM CHLORIDE 0.9 % IV SOLN
Freq: Once | INTRAVENOUS | Status: AC
  Administered 2018-03-21: 14:00:00 via INTRAVENOUS
  Filled 2018-03-21: qty 250

## 2018-03-27 ENCOUNTER — Encounter: Payer: Medicaid Other | Admitting: Obstetrics and Gynecology

## 2018-03-28 ENCOUNTER — Inpatient Hospital Stay: Payer: Medicaid Other

## 2018-03-28 ENCOUNTER — Ambulatory Visit (INDEPENDENT_AMBULATORY_CARE_PROVIDER_SITE_OTHER): Payer: Medicaid Other | Admitting: Obstetrics and Gynecology

## 2018-03-28 VITALS — BP 92/62 | HR 96 | Temp 96.8°F | Resp 18

## 2018-03-28 VITALS — BP 101/65 | HR 94 | Wt 180.7 lb

## 2018-03-28 DIAGNOSIS — D508 Other iron deficiency anemias: Secondary | ICD-10-CM | POA: Diagnosis not present

## 2018-03-28 DIAGNOSIS — Z3685 Encounter for antenatal screening for Streptococcus B: Secondary | ICD-10-CM

## 2018-03-28 DIAGNOSIS — Z3493 Encounter for supervision of normal pregnancy, unspecified, third trimester: Secondary | ICD-10-CM

## 2018-03-28 DIAGNOSIS — Z113 Encounter for screening for infections with a predominantly sexual mode of transmission: Secondary | ICD-10-CM

## 2018-03-28 LAB — POCT URINALYSIS DIPSTICK OB
BILIRUBIN UA: NEGATIVE
Blood, UA: NEGATIVE
Glucose, UA: NEGATIVE
Ketones, UA: NEGATIVE
Leukocytes, UA: NEGATIVE
Nitrite, UA: NEGATIVE
PH UA: 6.5 (ref 5.0–8.0)
Spec Grav, UA: 1.01 (ref 1.010–1.025)
UROBILINOGEN UA: 0.2 U/dL

## 2018-03-28 MED ORDER — IRON SUCROSE 20 MG/ML IV SOLN
200.0000 mg | Freq: Once | INTRAVENOUS | Status: AC
  Administered 2018-03-28: 200 mg via INTRAVENOUS
  Filled 2018-03-28: qty 10

## 2018-03-28 MED ORDER — SODIUM CHLORIDE 0.9 % IV SOLN
Freq: Once | INTRAVENOUS | Status: AC
  Administered 2018-03-28: 14:00:00 via INTRAVENOUS
  Filled 2018-03-28: qty 250

## 2018-03-28 NOTE — Progress Notes (Signed)
ROB- cultures obtained, labor precautions discussed. Retained condom removed without difficulty.

## 2018-03-28 NOTE — Progress Notes (Signed)
ROB- cultures obtained, pt is having pelvic pressure 

## 2018-03-30 LAB — GC/CHLAMYDIA PROBE AMP
Chlamydia trachomatis, NAA: NEGATIVE
NEISSERIA GONORRHOEAE BY PCR: NEGATIVE

## 2018-03-30 LAB — STREP GP B NAA: STREP GROUP B AG: NEGATIVE

## 2018-04-03 ENCOUNTER — Other Ambulatory Visit: Payer: Self-pay

## 2018-04-03 ENCOUNTER — Inpatient Hospital Stay
Admission: EM | Admit: 2018-04-03 | Discharge: 2018-04-03 | Disposition: A | Discharge status: Home / Self Care | Payer: Medicaid Other | Attending: Obstetrics and Gynecology | Admitting: Obstetrics and Gynecology

## 2018-04-03 DIAGNOSIS — O99013 Anemia complicating pregnancy, third trimester: Secondary | ICD-10-CM | POA: Diagnosis present

## 2018-04-03 DIAGNOSIS — Z3A37 37 weeks gestation of pregnancy: Secondary | ICD-10-CM

## 2018-04-03 DIAGNOSIS — O471 False labor at or after 37 completed weeks of gestation: Secondary | ICD-10-CM

## 2018-04-03 DIAGNOSIS — O479 False labor, unspecified: Secondary | ICD-10-CM

## 2018-04-03 NOTE — Discharge Instructions (Signed)
Call your provider with any other concerns

## 2018-04-03 NOTE — OB Triage Provider Note (Signed)
Allison [name] is a 27 y.o. G2P1001 at [redacted]w[redacted]d who is admitted for uterine contractions.  Estimated Date of Delivery: 04/22/18 Fetal presentation is cephalic.  Length of Stay:  0 Days. Admitted 04/03/2018  Subjective: Reports increased back pressure with contractions when walking. Patient reports good fetal movement.  She reports irregular and mild uterine contractions, no bleeding and no loss of fluid per vagina. NTZ negative.  Vitals:  Blood pressure 97/67, pulse 91, temperature 98.2 F (36.8 C), temperature source Oral, resp. rate 18, last menstrual period 07/08/2017. Physical Examination: CONSTITUTIONAL: Well-developed, well-nourished female in no acute distress.  SKIN: Skin is warm and dry. No rash noted. Not diaphoretic. No erythema. No pallor. NEUROLGIC: Alert and oriented to person, place, and time. Normal reflexes, muscle tone coordination. No cranial nerve deficit noted. PSYCHIATRIC: Normal mood and affect. Normal behavior. Normal judgment and thought content. CARDIOVASCULAR: Normal heart rate noted, regular rhythm RESPIRATORY: Effort and breath sounds normal, no problems with respiration noted ABDOMEN: Soft, nontender, nondistended, gravid.fetus in ROP to transverse positioning. CERVIX: Dilation: 1 Effacement (%): 50 Cervical Position: Posterior Station: -3 Exam by:: A. White, RN  Fetal monitoring: FHR: 145 bpm, Variability: moderate, Accelerations: Present, Decelerations: Absent  Uterine activity: 1-4 contractions per hour, mild to palpation.  No results found for this or any previous visit (from the past 48 hour(s)).  No results found.  Current scheduled medications   I have reviewed the patient's current medications.  ASSESSMENT: Patient Active Problem List   Diagnosis Date Noted  . Iron deficiency anemia 03/13/2018  . Pregnancy 12/06/2017    PLAN: Reassured of normal findings and not in active labor, encouraged positions for optimal fetal  positions. Continue routine antenatal    N , CNM [facility] CARE

## 2018-04-03 NOTE — Discharge Summary (Signed)
Patient discharged home with family. Discharge instructions and labor precautions reviewed. Patient verbalizes understanding. Follow up scheduled for Friday. Denies any other needs and concerns at this time.

## 2018-04-04 ENCOUNTER — Ambulatory Visit: Payer: Self-pay

## 2018-04-05 ENCOUNTER — Inpatient Hospital Stay: Payer: Medicaid Other | Attending: Oncology

## 2018-04-05 ENCOUNTER — Ambulatory Visit (INDEPENDENT_AMBULATORY_CARE_PROVIDER_SITE_OTHER): Payer: Medicaid Other | Admitting: Obstetrics and Gynecology

## 2018-04-05 VITALS — BP 95/61 | HR 85 | Temp 96.9°F | Resp 18

## 2018-04-05 VITALS — BP 96/63 | HR 90 | Wt 183.3 lb

## 2018-04-05 DIAGNOSIS — D508 Other iron deficiency anemias: Secondary | ICD-10-CM | POA: Diagnosis not present

## 2018-04-05 DIAGNOSIS — Z3493 Encounter for supervision of normal pregnancy, unspecified, third trimester: Secondary | ICD-10-CM | POA: Diagnosis not present

## 2018-04-05 LAB — POCT URINALYSIS DIPSTICK OB
Bilirubin, UA: NEGATIVE
Blood, UA: NEGATIVE
GLUCOSE, UA: NEGATIVE
Ketones, UA: NEGATIVE
LEUKOCYTES UA: NEGATIVE
Nitrite, UA: NEGATIVE
POC,PROTEIN,UA: NEGATIVE
SPEC GRAV UA: 1.01 (ref 1.010–1.025)
Urobilinogen, UA: 0.2 E.U./dL
pH, UA: 7 (ref 5.0–8.0)

## 2018-04-05 MED ORDER — IRON SUCROSE 20 MG/ML IV SOLN
200.0000 mg | Freq: Once | INTRAVENOUS | Status: AC
  Administered 2018-04-05: 200 mg via INTRAVENOUS
  Filled 2018-04-05: qty 10

## 2018-04-05 MED ORDER — SODIUM CHLORIDE 0.9 % IV SOLN
Freq: Once | INTRAVENOUS | Status: AC
  Administered 2018-04-05: 14:00:00 via INTRAVENOUS
  Filled 2018-04-05: qty 250

## 2018-04-05 NOTE — Progress Notes (Signed)
ROB- pt is having pelvic pressure 

## 2018-04-05 NOTE — Progress Notes (Signed)
ROB- doing well, denies any increase in contractions since leaving the hospital. Baby vertex with head very posterior and off to right.

## 2018-04-11 ENCOUNTER — Ambulatory Visit (INDEPENDENT_AMBULATORY_CARE_PROVIDER_SITE_OTHER): Payer: Medicaid Other | Admitting: Certified Nurse Midwife

## 2018-04-11 ENCOUNTER — Inpatient Hospital Stay: Payer: Medicaid Other

## 2018-04-11 VITALS — BP 97/63 | HR 81 | Wt 182.3 lb

## 2018-04-11 VITALS — BP 104/63 | HR 86 | Temp 96.7°F | Resp 20

## 2018-04-11 DIAGNOSIS — N949 Unspecified condition associated with female genital organs and menstrual cycle: Secondary | ICD-10-CM

## 2018-04-11 DIAGNOSIS — R102 Pelvic and perineal pain: Secondary | ICD-10-CM

## 2018-04-11 DIAGNOSIS — O26893 Other specified pregnancy related conditions, third trimester: Secondary | ICD-10-CM

## 2018-04-11 DIAGNOSIS — M549 Dorsalgia, unspecified: Secondary | ICD-10-CM

## 2018-04-11 DIAGNOSIS — D508 Other iron deficiency anemias: Secondary | ICD-10-CM

## 2018-04-11 DIAGNOSIS — Z3493 Encounter for supervision of normal pregnancy, unspecified, third trimester: Secondary | ICD-10-CM

## 2018-04-11 DIAGNOSIS — O9989 Other specified diseases and conditions complicating pregnancy, childbirth and the puerperium: Secondary | ICD-10-CM

## 2018-04-11 LAB — POCT URINALYSIS DIPSTICK OB
BILIRUBIN UA: NEGATIVE
Blood, UA: NEGATIVE
Glucose, UA: NEGATIVE
Nitrite, UA: NEGATIVE
PH UA: 6.5 (ref 5.0–8.0)
Spec Grav, UA: 1.015 (ref 1.010–1.025)
UROBILINOGEN UA: 0.2 U/dL

## 2018-04-11 MED ORDER — SODIUM CHLORIDE 0.9 % IV SOLN
Freq: Once | INTRAVENOUS | Status: AC
  Administered 2018-04-11: 14:00:00 via INTRAVENOUS
  Filled 2018-04-11: qty 250

## 2018-04-11 MED ORDER — IRON SUCROSE 20 MG/ML IV SOLN
200.0000 mg | Freq: Once | INTRAVENOUS | Status: AC
  Administered 2018-04-11: 200 mg via INTRAVENOUS
  Filled 2018-04-11: qty 10

## 2018-04-11 NOTE — Progress Notes (Signed)
ROB-Reports intermittent pelvic pressure and low back pain. Discussed home treatment measures. Requests SVE, unchanged from previous exam. Iron infusion earlier today. Anticipatory guidance regarding course of prenatal care. Reviewed red flag symptoms and when to call. RTC 1 week for ROB or sooner if needed.

## 2018-04-11 NOTE — Patient Instructions (Addendum)
Round Ligament Pain °The round ligament is a cord of muscle and tissue that helps to support the uterus. It can become a source of pain during pregnancy if it becomes stretched or twisted as the baby grows. The pain usually begins in the second trimester of pregnancy, and it can come and go until the baby is delivered. It is not a serious problem, and it does not cause harm to the baby. °Round ligament pain is usually a short, sharp, and pinching pain, but it can also be a dull, lingering, and aching pain. The pain is felt in the lower side of the abdomen or in the groin. It usually starts deep in the groin and moves up to the outside of the hip area. Pain can occur with: °· A sudden change in position. °· Rolling over in bed. °· Coughing or sneezing. °· Physical activity. ° °Follow these instructions at home: °Watch your condition for any changes. Take these steps to help with your pain: °· When the pain starts, relax. Then try: °? Sitting down. °? Flexing your knees up to your abdomen. °? Lying on your side with one pillow under your abdomen and another pillow between your legs. °? Sitting in a warm bath for 15-20 minutes or until the pain goes away. °· Take over-the-counter and prescription medicines only as told by your health care provider. °· Move slowly when you sit and stand. °· Avoid long walks if they cause pain. °· Stop or lessen your physical activities if they cause pain. ° °Contact a health care provider if: °· Your pain does not go away with treatment. °· You feel pain in your back that you did not have before. °· Your medicine is not helping. °Get help right away if: °· You develop a fever or chills. °· You develop uterine contractions. °· You develop vaginal bleeding. °· You develop nausea or vomiting. °· You develop diarrhea. °· You have pain when you urinate. °This information is not intended to replace advice given to you by your health care provider. Make sure you discuss any questions you have  with your health care provider. °Document Released: 02/22/2008 Document Revised: 10/21/2015 Document Reviewed: 07/22/2014 °Elsevier Interactive Patient Education © 2018 [employer] °Back Pain in Pregnancy °Back pain during pregnancy is common. Back pain may be caused by several factors that are related to changes during your pregnancy. °Follow these instructions at home: °Managing pain, stiffness, and swelling °· If directed, apply ice for sudden (acute) back pain. °? Put ice in a plastic bag. °? Place a towel between your skin and the bag. °? Leave the ice on for 20 minutes, 2-3 times per day. °· If directed, apply heat to the affected area before you exercise: °? Place a towel between your skin and the heat pack or heating pad. °? Leave the heat on for 20-30 minutes. °? Remove the heat if your skin turns bright red. This is especially important if you are unable to feel pain, heat, or cold. You may have a greater risk of getting burned. °Activity °· Exercise as told by your health care provider. Exercising is the best way to prevent or manage back pain. °· Listen to your body when lifting. If lifting hurts, ask for help or bend your knees. This uses your leg muscles instead of your back muscles. °· Squat down when picking up something from the floor. Do not bend over. °· Only use bed rest as told by your health care provider. Bed   rest should only be used for the most severe episodes of back pain. °Standing, Sitting, and Lying Down °· Do not stand in one place for long periods of time. °· Use good posture when sitting. Make sure your head rests over your shoulders and is not hanging forward. Use a pillow on your lower back if necessary. °· Try sleeping on your side, preferably the left side, with a pillow or two between your legs. If you are sore after a night's rest, your bed may be too soft. A firm mattress may provide more support for your back during pregnancy. °General instructions °· Do not wear high  heels. °· Eat a healthy diet. Try to gain weight within your health care provider's recommendations. °· Use a maternity girdle, elastic sling, or back brace as told by your health care provider. °· Take over-the-counter and prescription medicines only as told by your health care provider. °· Keep all follow-up visits as told by your health care provider. This is important. This includes any visits with any specialists, such as a physical therapist. °Contact a health care provider if: °· Your back pain interferes with your daily activities. °· You have increasing pain in other parts of your body. °Get help right away if: °· You develop numbness, tingling, weakness, or problems with the use of your arms or legs. °· You develop severe back pain that is not controlled with medicine. °· You have a sudden change in bowel or bladder control. °· You develop shortness of breath, dizziness, or you faint. °· You develop nausea, vomiting, or sweating. °· You have back pain that is a rhythmic, cramping pain similar to labor pains. Labor pain is usually 1-2 minutes apart, lasts for about 1 minute, and involves a bearing down feeling or pressure in your pelvis. °· You have back pain and your water breaks or you have vaginal bleeding. °· You have back pain or numbness that travels down your leg. °· Your back pain developed after you fell. °· You develop pain on one side of your back. °· You see blood in your urine. °· You develop skin blisters in the area of your back pain. °This information is not intended to replace advice given to you by your health care provider. Make sure you discuss any questions you have with your health care provider. °Document Released: 08/23/2005 Document Revised: 10/21/2015 Document Reviewed: 01/27/2015 °Elsevier Interactive Patient Education © 2018 [employer] ° °

## 2018-04-11 NOTE — Progress Notes (Signed)
ROB, c/o intermittent lower pelvic pressure and lower back pain.

## 2018-04-19 ENCOUNTER — Encounter: Payer: Self-pay | Admitting: Certified Nurse Midwife

## 2018-04-19 ENCOUNTER — Ambulatory Visit (INDEPENDENT_AMBULATORY_CARE_PROVIDER_SITE_OTHER): Payer: Medicaid Other | Admitting: Certified Nurse Midwife

## 2018-04-19 VITALS — BP 102/68 | HR 85 | Wt 182.2 lb

## 2018-04-19 DIAGNOSIS — Z3493 Encounter for supervision of normal pregnancy, unspecified, third trimester: Secondary | ICD-10-CM

## 2018-04-19 LAB — POCT URINALYSIS DIPSTICK OB
Bilirubin, UA: NEGATIVE
Blood, UA: NEGATIVE
Glucose, UA: NEGATIVE
KETONES UA: NEGATIVE
Leukocytes, UA: NEGATIVE
NITRITE UA: NEGATIVE
PH UA: 5 (ref 5.0–8.0)
PROTEIN: NEGATIVE
Spec Grav, UA: 1.015 (ref 1.010–1.025)
UROBILINOGEN UA: 0.2 U/dL

## 2018-04-19 NOTE — Patient Instructions (Signed)
Braxton Hicks Contractions °Contractions of the uterus can occur throughout pregnancy, but they are not always a sign that you are in labor. You may have practice contractions called Braxton Hicks contractions. These false labor contractions are sometimes confused with true labor. °What are Braxton Hicks contractions? °Braxton Hicks contractions are tightening movements that occur in the muscles of the uterus before labor. Unlike true labor contractions, these contractions do not result in opening (dilation) and thinning of the cervix. Toward the end of pregnancy (32-34 weeks), Braxton Hicks contractions can happen more often and may become stronger. These contractions are sometimes difficult to tell apart from true labor because they can be very uncomfortable. You should not feel embarrassed if you go to the hospital with false labor. °Sometimes, the only way to tell if you are in true labor is for your health care provider to look for changes in the cervix. The health care provider will do a physical exam and may monitor your contractions. If you are not in true labor, the exam should show that your cervix is not dilating and your water has not broken. °If there are other health problems associated with your pregnancy, it is completely safe for you to be sent home with false labor. You may continue to have Braxton Hicks contractions until you go into true labor. °How to tell the difference between true labor and false labor °True labor °· Contractions last 30-70 seconds. °· Contractions become very regular. °· Discomfort is usually felt in the top of the uterus, and it spreads to the lower abdomen and low back. °· Contractions do not go away with walking. °· Contractions usually become more intense and increase in frequency. °· The cervix dilates and gets thinner. °False labor °· Contractions are usually shorter and not as strong as true labor contractions. °· Contractions are usually irregular. °· Contractions  are often felt in the front of the lower abdomen and in the groin. °· Contractions may go away when you walk around or change positions while lying down. °· Contractions get weaker and are shorter-lasting as time goes on. °· The cervix usually does not dilate or become thin. °Follow these instructions at home: °· Take over-the-counter and prescription medicines only as told by your health care provider. °· Keep up with your usual exercises and follow other instructions from your health care provider. °· Eat and drink lightly if you think you are going into labor. °· If Braxton Hicks contractions are making you uncomfortable: °? Change your position from lying down or resting to walking, or change from walking to resting. °? Sit and rest in a tub of warm water. °? Drink enough fluid to keep your urine pale yellow. Dehydration may cause these contractions. °? Do slow and deep breathing several times an hour. °· Keep all follow-up prenatal visits as told by your health care provider. This is important. °Contact a health care provider if: °· You have a fever. °· You have continuous pain in your abdomen. °Get help right away if: °· Your contractions become stronger, more regular, and closer together. °· You have fluid leaking or gushing from your vagina. °· You pass blood-tinged mucus (bloody show). °· You have bleeding from your vagina. °· You have low back pain that you never had before. °· You feel your baby’s head pushing down and causing pelvic pressure. °· Your baby is not moving inside you as much as it used to. °Summary °· Contractions that occur before labor are called Braxton   Hicks contractions, false labor, or practice contractions. °· Braxton Hicks contractions are usually shorter, weaker, farther apart, and less regular than true labor contractions. True labor contractions usually become progressively stronger and regular and they become more frequent. °· Manage discomfort from Braxton Hicks contractions by  changing position, resting in a warm bath, drinking plenty of water, or practicing deep breathing. °This information is not intended to replace advice given to you by your health care provider. Make sure you discuss any questions you have with your health care provider. °Document Released: 09/28/2016 Document Revised: 09/28/2016 Document Reviewed: 09/28/2016 °Elsevier Interactive Patient Education © 2018 [employer] Inc. ° °

## 2018-04-19 NOTE — Progress Notes (Signed)
ROB, pt has completed full course of iron transfusions. Discussed BPP at next visit. Will scheduled induction at next visit. Labor precaution reviewed . Herbal prep handout given, discussed self help measure to help labor occur. Follow up 1 wk.   Doreene BurkeAnnie Raygen Linquist, CNM

## 2018-04-21 ENCOUNTER — Other Ambulatory Visit: Payer: Self-pay

## 2018-04-21 ENCOUNTER — Encounter: Payer: Self-pay | Admitting: *Deleted

## 2018-04-21 ENCOUNTER — Inpatient Hospital Stay
Admission: EM | Admit: 2018-04-21 | Discharge: 2018-04-21 | Disposition: A | Discharge status: Home / Self Care | Payer: Medicaid Other | Attending: Obstetrics and Gynecology | Admitting: Obstetrics and Gynecology

## 2018-04-21 DIAGNOSIS — Z3A39 39 weeks gestation of pregnancy: Secondary | ICD-10-CM | POA: Diagnosis not present

## 2018-04-21 DIAGNOSIS — O479 False labor, unspecified: Secondary | ICD-10-CM

## 2018-04-21 DIAGNOSIS — O4292 Full-term premature rupture of membranes, unspecified as to length of time between rupture and onset of labor: Secondary | ICD-10-CM | POA: Insufficient documentation

## 2018-04-21 NOTE — OB Triage Note (Signed)
Patient for observation.  She states that she felt like she had leaked fluid on her bed around 6pm Friday afternoon. She reports baby moving well. No bleeding.  She was wearing a panty liner upon arrival.  Cervix check was 2/60/-3. Monitors applied occasional contraction with UI noted. She denies pain at this time.

## 2018-04-21 NOTE — OB Triage Provider Note (Signed)
L&D OB Triage Note  Allison [name] is a 27 y.o. G2P1001 female at 6969w6d, EDD Estimated Date of Delivery: 04/22/18 who presented to triage for complaints of leaking fluid x 3 days with scant brown discharge.  She was evaluated by myself with no significant findings/findings significant for labor or ROM. Vital signs stable. An NST was performed and is reactive with negative spontaneous OCT.   NST INTERPRETATION: Indications: rule out uterine contractions  Mode: External(monitors applied and assessing) Baseline Rate (A): 145 bpm(fhr at this time)              Impression: reactive   Plan: NST performed was reviewed and was found to be reactive. She was discharged home with bleeding/labor precautions.  Continue routine prenatal care. Follow up with OB/GYN as previously scheduled.     Melody Suzan NailerN Shambley, CNM

## 2018-04-22 ENCOUNTER — Telehealth: Payer: Self-pay | Admitting: Certified Nurse Midwife

## 2018-04-22 NOTE — Telephone Encounter (Signed)
The patient is still having bloody show that is pink in color.  Still having some leaking but nothing significant, and she states "no gush" of fluid.  She is also not having any true contractions at this time.  She was curious if she needed to be checked for the pink discharge, and possible leakage?  She was in the ER yesterday and saw Melody, and her EDD is today, 04/22/18.  She was told a message would be prioritized and sent to her nurse, but if her symptoms get worse to call back or go to L&D if she has not been contacted.  Please advise, thanks.

## 2018-04-23 ENCOUNTER — Other Ambulatory Visit: Payer: Self-pay

## 2018-04-23 ENCOUNTER — Observation Stay
Admission: EM | Admit: 2018-04-23 | Discharge: 2018-04-23 | Disposition: A | Discharge status: Home / Self Care | Payer: Medicaid Other | Source: Home / Self Care | Admitting: Obstetrics and Gynecology

## 2018-04-23 DIAGNOSIS — D649 Anemia, unspecified: Secondary | ICD-10-CM

## 2018-04-23 DIAGNOSIS — O480 Post-term pregnancy: Secondary | ICD-10-CM | POA: Diagnosis not present

## 2018-04-23 DIAGNOSIS — Z3A40 40 weeks gestation of pregnancy: Secondary | ICD-10-CM | POA: Insufficient documentation

## 2018-04-23 DIAGNOSIS — O99013 Anemia complicating pregnancy, third trimester: Secondary | ICD-10-CM | POA: Insufficient documentation

## 2018-04-23 LAB — RUPTURE OF MEMBRANE (ROM)PLUS
Rom Plus: NEGATIVE
Rom Plus: NEGATIVE

## 2018-04-23 NOTE — Discharge Instructions (Signed)
Please come back to Emergency Department for the following symptoms:  -decreased fetal movement  -vaginal bleeding  -leakage of fluid  -contractions every 5 minutes  -"rock hard" abdomen that does not relax

## 2018-04-24 ENCOUNTER — Other Ambulatory Visit: Payer: Self-pay

## 2018-04-24 ENCOUNTER — Inpatient Hospital Stay: Payer: Medicaid Other | Admitting: Anesthesiology

## 2018-04-24 ENCOUNTER — Other Ambulatory Visit: Payer: Self-pay | Admitting: Obstetrics and Gynecology

## 2018-04-24 ENCOUNTER — Inpatient Hospital Stay
Admission: EM | Admit: 2018-04-24 | Discharge: 2018-04-25 | DRG: 807 | Disposition: A | Discharge status: Home / Self Care | Payer: Medicaid Other | Attending: Certified Nurse Midwife | Admitting: Certified Nurse Midwife

## 2018-04-24 ENCOUNTER — Ambulatory Visit (INDEPENDENT_AMBULATORY_CARE_PROVIDER_SITE_OTHER): Payer: Medicaid Other | Admitting: Certified Nurse Midwife

## 2018-04-24 ENCOUNTER — Ambulatory Visit (INDEPENDENT_AMBULATORY_CARE_PROVIDER_SITE_OTHER): Payer: Medicaid Other

## 2018-04-24 VITALS — BP 103/61 | HR 83 | Wt 183.0 lb

## 2018-04-24 DIAGNOSIS — O9902 Anemia complicating childbirth: Secondary | ICD-10-CM | POA: Diagnosis present

## 2018-04-24 DIAGNOSIS — Z3483 Encounter for supervision of other normal pregnancy, third trimester: Secondary | ICD-10-CM | POA: Diagnosis present

## 2018-04-24 DIAGNOSIS — O480 Post-term pregnancy: Secondary | ICD-10-CM | POA: Diagnosis not present

## 2018-04-24 DIAGNOSIS — D509 Iron deficiency anemia, unspecified: Secondary | ICD-10-CM | POA: Diagnosis present

## 2018-04-24 DIAGNOSIS — Z3A40 40 weeks gestation of pregnancy: Secondary | ICD-10-CM

## 2018-04-24 LAB — CBC
HCT: 33.9 % — ABNORMAL LOW (ref 36.0–46.0)
HEMOGLOBIN: 10.5 g/dL — AB (ref 12.0–15.0)
MCH: 23.3 pg — ABNORMAL LOW (ref 26.0–34.0)
MCHC: 31 g/dL (ref 30.0–36.0)
MCV: 75.2 fL — ABNORMAL LOW (ref 80.0–100.0)
NRBC: 0 % (ref 0.0–0.2)
PLATELETS: 148 10*3/uL — AB (ref 150–400)
RBC: 4.51 MIL/uL (ref 3.87–5.11)
RDW: 15.3 % (ref 11.5–15.5)
WBC: 9 10*3/uL (ref 4.0–10.5)

## 2018-04-24 LAB — TYPE AND SCREEN
ABO/RH(D): O POS
ANTIBODY SCREEN: NEGATIVE

## 2018-04-24 MED ORDER — LACTATED RINGERS IV SOLN
INTRAVENOUS | Status: DC
  Administered 2018-04-24 (×2): via INTRAVENOUS

## 2018-04-24 MED ORDER — IBUPROFEN 600 MG PO TABS
600.0000 mg | ORAL_TABLET | Freq: Four times a day (QID) | ORAL | Status: DC
  Administered 2018-04-24 – 2018-04-25 (×4): 600 mg via ORAL
  Filled 2018-04-24 (×4): qty 1

## 2018-04-24 MED ORDER — OXYTOCIN BOLUS FROM INFUSION
500.0000 mL | Freq: Once | INTRAVENOUS | Status: AC
  Administered 2018-04-24: 500 mL via INTRAVENOUS

## 2018-04-24 MED ORDER — EPHEDRINE 5 MG/ML INJ
10.0000 mg | INTRAVENOUS | Status: DC | PRN
  Filled 2018-04-24: qty 2

## 2018-04-24 MED ORDER — FENTANYL 2.5 MCG/ML W/ROPIVACAINE 0.15% IN NS 100 ML EPIDURAL (ARMC)
12.0000 mL/h | EPIDURAL | Status: DC
  Administered 2018-04-24: 12 mL/h via EPIDURAL

## 2018-04-24 MED ORDER — SODIUM CHLORIDE 0.9 % IV SOLN
INTRAVENOUS | Status: DC | PRN
  Administered 2018-04-24 (×2): 5 mL via EPIDURAL

## 2018-04-24 MED ORDER — PRENATAL MULTIVITAMIN CH
1.0000 | ORAL_TABLET | Freq: Every day | ORAL | Status: DC
  Administered 2018-04-25: 1 via ORAL
  Filled 2018-04-24: qty 1

## 2018-04-24 MED ORDER — SIMETHICONE 80 MG PO CHEW: 80.0000 mg | CHEWABLE_TABLET | ORAL | Status: DC | PRN

## 2018-04-24 MED ORDER — OXYTOCIN 40 UNITS IN LACTATED RINGERS INFUSION - SIMPLE MED: 2.5000 [IU]/h | INTRAVENOUS | Status: DC

## 2018-04-24 MED ORDER — ACETAMINOPHEN 325 MG PO TABS: 650.0000 mg | ORAL_TABLET | ORAL | Status: DC | PRN

## 2018-04-24 MED ORDER — OXYTOCIN 10 UNIT/ML IJ SOLN: 10.0000 [IU] | Freq: Once | INTRAMUSCULAR | Status: DC

## 2018-04-24 MED ORDER — WITCH HAZEL-GLYCERIN EX PADS: 1.0000 "application " | MEDICATED_PAD | CUTANEOUS | Status: DC | PRN

## 2018-04-24 MED ORDER — FENTANYL 2.5 MCG/ML W/ROPIVACAINE 0.15% IN NS 100 ML EPIDURAL (ARMC)
EPIDURAL | Status: AC
  Filled 2018-04-24: qty 100

## 2018-04-24 MED ORDER — METHYLERGONOVINE MALEATE 0.2 MG/ML IJ SOLN: 0.2000 mg | INTRAMUSCULAR | Status: DC | PRN

## 2018-04-24 MED ORDER — ONDANSETRON HCL 4 MG PO TABS: 4.0000 mg | ORAL_TABLET | ORAL | Status: DC | PRN

## 2018-04-24 MED ORDER — TERBUTALINE SULFATE 1 MG/ML IJ SOLN
0.2500 mg | Freq: Once | INTRAMUSCULAR | Status: DC | PRN
  Filled 2018-04-24: qty 1

## 2018-04-24 MED ORDER — PHENYLEPHRINE 40 MCG/ML (10ML) SYRINGE FOR IV PUSH (FOR BLOOD PRESSURE SUPPORT)
80.0000 ug | PREFILLED_SYRINGE | INTRAVENOUS | Status: DC | PRN
  Filled 2018-04-24: qty 5

## 2018-04-24 MED ORDER — TETANUS-DIPHTH-ACELL PERTUSSIS 5-2.5-18.5 LF-MCG/0.5 IM SUSP: 0.5000 mL | Freq: Once | INTRAMUSCULAR | Status: DC

## 2018-04-24 MED ORDER — ONDANSETRON HCL 4 MG/2ML IJ SOLN: 4.0000 mg | INTRAMUSCULAR | Status: DC | PRN

## 2018-04-24 MED ORDER — DIBUCAINE 1 % RE OINT: 1.0000 "application " | TOPICAL_OINTMENT | RECTAL | Status: DC | PRN

## 2018-04-24 MED ORDER — BUTORPHANOL TARTRATE 2 MG/ML IJ SOLN: 1.0000 mg | INTRAMUSCULAR | Status: DC | PRN

## 2018-04-24 MED ORDER — ONDANSETRON HCL 4 MG/2ML IJ SOLN: 4.0000 mg | Freq: Four times a day (QID) | INTRAMUSCULAR | Status: DC | PRN

## 2018-04-24 MED ORDER — METHYLERGONOVINE MALEATE 0.2 MG PO TABS: 0.2000 mg | ORAL_TABLET | ORAL | Status: DC | PRN

## 2018-04-24 MED ORDER — AMMONIA AROMATIC IN INHA
RESPIRATORY_TRACT | Status: AC
  Filled 2018-04-24: qty 10

## 2018-04-24 MED ORDER — OXYCODONE-ACETAMINOPHEN 5-325 MG PO TABS: 1.0000 | ORAL_TABLET | ORAL | Status: DC | PRN

## 2018-04-24 MED ORDER — SENNOSIDES-DOCUSATE SODIUM 8.6-50 MG PO TABS
2.0000 | ORAL_TABLET | ORAL | Status: DC
  Administered 2018-04-24: 2 via ORAL
  Filled 2018-04-24: qty 2

## 2018-04-24 MED ORDER — LIDOCAINE HCL (PF) 1 % IJ SOLN
INTRAMUSCULAR | Status: AC
  Filled 2018-04-24: qty 30

## 2018-04-24 MED ORDER — LACTATED RINGERS IV SOLN: 500.0000 mL | Freq: Once | INTRAVENOUS | Status: DC

## 2018-04-24 MED ORDER — LACTATED RINGERS IV SOLN
500.0000 mL | INTRAVENOUS | Status: DC | PRN
  Administered 2018-04-24: 250 mL via INTRAVENOUS
  Administered 2018-04-24 (×2): 500 mL via INTRAVENOUS

## 2018-04-24 MED ORDER — LIDOCAINE HCL (PF) 1 % IJ SOLN
INTRAMUSCULAR | Status: DC | PRN
  Administered 2018-04-24: 1 mL via INTRADERMAL

## 2018-04-24 MED ORDER — OXYCODONE-ACETAMINOPHEN 5-325 MG PO TABS: 2.0000 | ORAL_TABLET | ORAL | Status: DC | PRN

## 2018-04-24 MED ORDER — DOCUSATE SODIUM 100 MG PO CAPS
100.0000 mg | ORAL_CAPSULE | Freq: Two times a day (BID) | ORAL | Status: DC
  Administered 2018-04-25: 100 mg via ORAL
  Filled 2018-04-24: qty 1

## 2018-04-24 MED ORDER — FERROUS SULFATE 325 (65 FE) MG PO TABS
325.0000 mg | ORAL_TABLET | Freq: Every day | ORAL | Status: DC
  Administered 2018-04-25: 325 mg via ORAL
  Filled 2018-04-24: qty 1

## 2018-04-24 MED ORDER — MISOPROSTOL 200 MCG PO TABS
ORAL_TABLET | ORAL | Status: AC
  Filled 2018-04-24: qty 4

## 2018-04-24 MED ORDER — TERBUTALINE SULFATE 1 MG/ML IJ SOLN: 0.2500 mg | Freq: Once | INTRAMUSCULAR | Status: DC | PRN

## 2018-04-24 MED ORDER — DIPHENHYDRAMINE HCL 50 MG/ML IJ SOLN: 12.5000 mg | INTRAMUSCULAR | Status: DC | PRN

## 2018-04-24 MED ORDER — COCONUT OIL OIL
1.0000 "application " | TOPICAL_OIL | Status: DC | PRN
  Filled 2018-04-24: qty 120

## 2018-04-24 MED ORDER — BENZOCAINE-MENTHOL 20-0.5 % EX AERO: 1.0000 "application " | INHALATION_SPRAY | CUTANEOUS | Status: DC | PRN

## 2018-04-24 MED ORDER — SOD CITRATE-CITRIC ACID 500-334 MG/5ML PO SOLN: 30.0000 mL | ORAL | Status: DC | PRN

## 2018-04-24 MED ORDER — OXYTOCIN 10 UNIT/ML IJ SOLN
INTRAMUSCULAR | Status: AC
  Filled 2018-04-24: qty 2

## 2018-04-24 MED ORDER — PHENYLEPHRINE 40 MCG/ML (10ML) SYRINGE FOR IV PUSH (FOR BLOOD PRESSURE SUPPORT): 80.0000 ug | PREFILLED_SYRINGE | INTRAVENOUS | Status: DC | PRN

## 2018-04-24 MED ORDER — LIDOCAINE-EPINEPHRINE (PF) 1.5 %-1:200000 IJ SOLN
INTRAMUSCULAR | Status: DC | PRN
  Administered 2018-04-24: 3 mL via PERINEURAL

## 2018-04-24 MED ORDER — OXYTOCIN 40 UNITS IN LACTATED RINGERS INFUSION - SIMPLE MED
1.0000 m[IU]/min | INTRAVENOUS | Status: DC
  Administered 2018-04-24: 2 m[IU]/min via INTRAVENOUS
  Administered 2018-04-24: 4 m[IU]/min via INTRAVENOUS
  Filled 2018-04-24: qty 1000

## 2018-04-24 MED ORDER — LIDOCAINE HCL (PF) 1 % IJ SOLN: 30.0000 mL | INTRAMUSCULAR | Status: DC | PRN

## 2018-04-24 MED ORDER — MISOPROSTOL 50MCG HALF TABLET: 50.0000 ug | ORAL_TABLET | ORAL | Status: DC | PRN

## 2018-04-24 NOTE — Discharge Summary (Signed)
Obstetric Discharge Summary  Patient ID: Allison [name] MRN: [id number] DOB/AGE: 29/08/1990 29 y.o.   Date of Admission: 04/23/2018  Date of Discharge: 04/23/2018  Admitting Diagnosis: Observation at 4116w1d  Secondary Diagnosis: Anemia in pregnancy  Discharge Diagnosis: No other diagnosis   Antepartum Procedures: NST and ROM plus x 2    Brief Hospital Course   L&D OB Triage Note  Allison [name] is a 27 y.o. 182P1001 female at 5065w2d, EDD Estimated Date of Delivery: 04/22/18 who presented to triage for complaints of leakage of fluid per vagina.  She was evaluated by myself with no significant findings for spontaneous rupture of membranes, labor, or fetal distress. Membranes were palpable during cervical exam and collected ROM Plus was negative. A repeat ROM Plus was collected by nursing staff with patient's concern for continuous leakage, findings were also negative. Vital signs stable. An NST was performed and has been reviewed by CNM.   NST INTERPRETATION: Indications: rule out uterine contractions  Mode: External Baseline Rate (A): 130 bpm Variability: Moderate Accelerations: 15 x 15 Decelerations: None Contraction Frequency (min): occasional w/ mild ui  Impression: reactive  Dilation: 3 Exam by:: Willodean RosenthalM. Reuven Braver, CNM  Recent Results (from the past 2160 hour(s))  Result Value Ref Range  ROM Plus ([facility] only)     Status: None   Collection Time: 04/23/18  6:32 PM  Result Value Ref Range   Rom Plus NEGATIVE     Comment: Performed at [facility], [address] [place], [place] [address]  ROM Plus [facility] only)     Status: None   Collection Time: 04/23/18  7:19 PM  Result Value Ref Range   Rom Plus NEGATIVE     Comment: Performed at [facility], [address] [place], [place] [address]    Plan: NST performed was reviewed and was found to be reactive. She was discharged home with bleeding/labor precautions.  Continue routine prenatal care.  Follow up with CNM as previously scheduled.   Discharge Instructions: Per After Visit Summary.  Activity: Also refer to After Visit Summary  Diet: Regular  Medications: Allergies as of 04/23/2018      Reactions   Dust Mite Extract Rash   Tree Extract Rash      Medication List    TAKE these medications   ferrous sulfate 325 (65 FE) MG EC tablet Take 325 mg by mouth daily with breakfast.   prenatal multivitamin Tabs tablet Take 1 tablet by mouth daily at 12 noon.      Outpatient follow up: As previously scheduled  Discharged Condition: stable  Discharged to: home   Gunnar BullaJenkins Michelle Olesya Wike, CNM [facility], [facility]

## 2018-04-24 NOTE — Progress Notes (Signed)
ROB doing well. BPP today for postdates . 8/8 . Pt continues to have copious amount of discharge. Nitrazine equivocal. Speculum exam shows clear to white discharge, no odor noted. Pt sent to L&D for induction of labor.  Doreene BurkeAnnie Arshdeep Bolger, CNM   Patient Name: [name] DOB: 12/05/1990 MRN: [id number]  ULTRASOUND REPORT  Location: [facility] Date of Service: 04/24/2018 Indications: Biophysical Profile   Findings:  Mason JimSingleton intrauterine pregnancy is visualized with FHR at 150 BPM.    Fetal presentation is Cephalic.  Placenta: anterior. Grade: 2 AFI: 13.1 cm  BPP Scoring: Movement: 2/2  Tone: 2/2  Breathing: 2/2  AFI: 2/2  Impression: 1. 1568w2d Viable Singleton Intrauterine pregnancy dated by previously established criteria. 2. AFI is 13.1 cm.  3. BPP is 8/8  Recommendations: 1.Clinical correlation with the patient's History and Physical Exam. 2.  Augustine RadarAbeer Alsammarraie ,RDMS

## 2018-04-24 NOTE — Anesthesia Procedure Notes (Signed)
Epidural Patient location during procedure: OB Start time: 04/24/2018 5:34 PM End time: 04/24/2018 5:42 PM  Staffing Anesthesiologist: Piscitello, Cleda MccreedyJoseph K, MD Performed: anesthesiologist   Preanesthetic Checklist Completed: patient identified, site marked, surgical consent, pre-op evaluation, timeout performed, IV checked, risks and benefits discussed and monitors and equipment checked  Epidural Patient position: sitting Prep: ChloraPrep Patient monitoring: heart rate, continuous pulse ox and blood pressure Approach: midline Location: L3-L4 Injection technique: LOR saline  Needle:  Needle type: Tuohy  Needle gauge: 17 G Needle length: 9 cm and 9 Needle insertion depth: 6 cm Catheter type: closed end flexible Catheter size: 19 Gauge Catheter at skin depth: 11 cm Test dose: negative and 1.5% lidocaine with Epi 1:200 K  Assessment Sensory level: T10 Events: blood not aspirated, injection not painful, no injection resistance, negative IV test and no paresthesia  Additional Notes 1 attempt Pt. Evaluated and documentation done after procedure finished. Patient identified. Risks/Benefits/Options discussed with patient including but not limited to bleeding, infection, nerve damage, paralysis, failed block, incomplete pain control, headache, blood pressure changes, nausea, vomiting, reactions to medication both or allergic, itching and postpartum back pain. Confirmed with bedside nurse the patient's most recent platelet count. Confirmed with patient that they are not currently taking any anticoagulation, have any bleeding history or any family history of bleeding disorders. Patient expressed understanding and wished to proceed. All questions were answered. Sterile technique was used throughout the entire procedure. Please see nursing notes for vital signs. Test dose was given through epidural catheter and negative prior to continuing to dose epidural or start infusion. Warning signs of  high block given to the patient including shortness of breath, tingling/numbness in hands, complete motor block, or any concerning symptoms with instructions to call for help. Patient was given instructions on fall risk and not to get out of bed. All questions and concerns addressed with instructions to call with any issues or inadequate analgesia.   Patient tolerated the insertion well without immediate complications.Reason for block:procedure for pain

## 2018-04-24 NOTE — H&P (Signed)
History and Physical   HPI  Allison [name] is a 27 y.o. G2P1001 at [redacted]w[redacted]d Estimated Date of Delivery: 04/22/18 who is being admitted for labor management irregular contractions . She was seen in the office today and complained of continued discharge and irregular contractions.    OB History  OB History  Gravida Para Term Preterm AB Living  2 1 1  0 0 1  SAB TAB Ectopic Multiple Live Births  0 0 0 0 1    # Outcome Date GA Lbr Len/2nd Weight Sex Delivery Anes PTL Lv  2 Current           1 Term 06/09/13 [redacted]w[redacted]d 12:09 / 01:22 3005 g M Vag-Spont EPI  LIV     Name: Allison [name]     Apgar1: 9  Apgar5: 9    PROBLEM LIST  Pregnancy complications or risks: Patient Active Problem List   Diagnosis Date Noted  . Labor and delivery, indication for care 04/24/2018  . Indication for care in labor and delivery, antepartum 04/23/2018  . Iron deficiency anemia 03/13/2018  . Pregnancy 12/06/2017    Prenatal labs and studies: ABO, Rh: --/--/O POS (11/27 1458) Antibody: NEG (11/27 1458) Rubella: 1.11 (04/26 1521) RPR: Non Reactive (09/04 1016)  HBsAg: Negative (04/26 1521)  HIV: Non Reactive (04/26 1521)  WUJ:WJXBJYNW (10/31 1647)   Past Medical History:  Diagnosis Date  . Anemia   . Asthma    exercise induced, inhaler use 8 years ago     Past Surgical History:  Procedure Laterality Date  . WISDOM TOOTH EXTRACTION       Medications    Current Discharge Medication List    CONTINUE these medications which have NOT CHANGED   Details  ferrous sulfate 325 (65 FE) MG EC tablet Take 325 mg by mouth daily with breakfast.     Prenatal Vit-Fe Fumarate-FA (PRENATAL MULTIVITAMIN) TABS tablet Take 1 tablet by mouth daily at 12 noon.         Allergies  Dust mite extract and Tree extract  Review of Systems  Constitutional: negative Eyes: negative Ears, nose, mouth, throat, and face: negative Respiratory: negative Cardiovascular: negative Gastrointestinal:  negative Genitourinary:negative Integument/breast: negative Hematologic/lymphatic: negative Musculoskeletal:negative Neurological: negative Behavioral/Psych: negative Endocrine: negative Allergic/Immunologic: negative  Physical Exam  BP (!) 92/54   Pulse (!) 107   Temp 98.8 F (37.1 C) (Oral)   Resp 15   Ht 5\' 7"  (1.702 m)   Wt 83 kg   LMP 07/08/2017 (Approximate)   SpO2 100%   BMI 28.66 kg/m   Lungs:  CTA B Cardio: RRR Abd: Soft, gravid, NT Presentation: cephalic EXT: No C/C/ 1+ Edema DTRs: 2+ B CERVIX: from office 4cm  See Prenatal records for more detailed PE.     FHR:  Baseline: 145 bpm, Variability: Good {> 6 bpm), Accelerations: Reactive and Decelerations: Absent  Toco: Uterine Contractions: Frequency: Every 2-3 minutes and Duration: 60-90 seconds   Test Results  Results for orders placed or performed during the hospital encounter of 04/24/18 (from the past 24 hour(s))  CBC     Status: Abnormal   Collection Time: 04/24/18  2:58 PM  Result Value Ref Range   WBC 9.0 4.0 - 10.5 K/uL   RBC 4.51 3.87 - 5.11 MIL/uL   Hemoglobin 10.5 (L) 12.0 - 15.0 g/dL   HCT 29.5 (L) 62.1 - 30.8 %   MCV 75.2 (L) 80.0 - 100.0 fL   MCH 23.3 (L) 26.0 - 34.0 pg  MCHC 31.0 30.0 - 36.0 g/dL   RDW 40.915.3 81.111.5 - 91.415.5 %   Platelets 148 (L) 150 - 400 K/uL   nRBC 0.0 0.0 - 0.2 %  Type and screen     Status: None   Collection Time: 04/24/18  2:58 PM  Result Value Ref Range   ABO/RH(D) O POS    Antibody Screen NEG    Sample Expiration      04/27/2018 Performed at [facility], [address] [place], [place] [address]    Group B Strep negative  Assessment   G2P1001 at 2746w2d Estimated Date of Delivery: 04/22/18  The fetus is reassuring.    Patient Active Problem List   Diagnosis Date Noted  . Labor and delivery, indication for care 04/24/2018  . Indication for care in labor and delivery, antepartum 04/23/2018  . Iron deficiency anemia 03/13/2018  .  Pregnancy 12/06/2017    Plan  1. Admit to L&D :   IV Pitocin augmentation 2. EFM:-- Category 1 3. Epidural if desired.  Stadol for IV pain until epidural requested. 4. Admission labs  5. Anticipate NSVD  Allison [name], CNM  04/24/2018 7:13 PM

## 2018-04-24 NOTE — Patient Instructions (Signed)
Labor Induction Labor induction is when steps are taken to cause a pregnant woman to begin the labor process. Most women go into labor on their own between 37 weeks and 42 weeks of the pregnancy. When this does not happen or when there is a medical need, methods may be used to induce labor. Labor induction causes a pregnant woman's uterus to contract. It also causes the cervix to soften (ripen), open (dilate), and thin out (efface). Usually, labor is not induced before 39 weeks of the pregnancy unless there is a problem with the baby or mother. Before inducing labor, your health care provider will consider a number of factors, including the following:  The medical condition of you and the baby.  How many weeks along you are.  The status of the baby's lung maturity.  The condition of the cervix.  The position of the baby. What are the reasons for labor induction? Labor may be induced for the following reasons:  The health of the baby or mother is at risk.  The pregnancy is overdue by 1 week or more.  The water breaks but labor does not start on its own.  The mother has a health condition or serious illness, such as high blood pressure, infection, placental abruption, or diabetes.  The amniotic fluid amounts are low around the baby.  The baby is distressed. Convenience or wanting the baby to be born on a certain date is not a reason for inducing labor. What methods are used for labor induction? Several methods of labor induction may be used, such as:  Prostaglandin medicine. This medicine causes the cervix to dilate and ripen. The medicine will also start contractions. It can be taken by mouth or by inserting a suppository into the vagina.  Inserting a thin tube (catheter) with a balloon on the end into the vagina to dilate the cervix. Once inserted, the balloon is expanded with water, which causes the cervix to open.  Stripping the membranes. Your health care provider separates  amniotic sac tissue from the cervix, causing the cervix to be stretched and causing the release of a hormone called progesterone. This may cause the uterus to contract. It is often done during an office visit. You will be sent home to wait for the contractions to begin. You will then come in for an induction.  Breaking the water. Your health care provider makes a hole in the amniotic sac using a small instrument. Once the amniotic sac breaks, contractions should begin. This may still take hours to see an effect.  Medicine to trigger or strengthen contractions. This medicine is given through an IV access tube inserted into a vein in your arm. All of the methods of induction, besides stripping the membranes, will be done in the hospital. Induction is done in the hospital so that you and the baby can be carefully monitored. How long does it take for labor to be induced? Some inductions can take up to 2-3 days. Depending on the cervix, it usually takes less time. It takes longer when you are induced early in the pregnancy or if this is your first pregnancy. If a mother is still pregnant and the induction has been going on for 2-3 days, either the mother will be sent home or a cesarean delivery will be needed. What are the risks associated with labor induction? Some of the risks of induction include:  Changes in fetal heart rate, such as too high, too low, or erratic.  Fetal distress.    Chance of infection for the mother and baby.  Increased chance of having a cesarean delivery.  Breaking off (abruption) of the placenta from the uterus (rare).  Uterine rupture (very rare). When induction is needed for medical reasons, the benefits of induction may outweigh the risks. What are some reasons for not inducing labor? Labor induction should not be done if:  It is shown that your baby does not tolerate labor.  You have had previous surgeries on your uterus, such as a myomectomy or the removal of  fibroids.  Your placenta lies very low in the uterus and blocks the opening of the cervix (placenta previa).  Your baby is not in a head-down position.  The umbilical cord drops down into the birth canal in front of the baby. This could cut off the baby's blood and oxygen supply.  You have had a previous cesarean delivery.  There are unusual circumstances, such as the baby being extremely premature. This information is not intended to replace advice given to you by your health care provider. Make sure you discuss any questions you have with your health care provider. Document Released: 10/04/2006 Document Revised: 10/21/2015 Document Reviewed: 12/12/2012 Elsevier Interactive Patient Education  2017 [employer]vier Inc.  

## 2018-04-24 NOTE — Progress Notes (Signed)
LABOR NOTE   Allison [name] 27 y.o.@ at 3035w2d Active phase labor.  SUBJECTIVE:  Comfortable with epidural  OBJECTIVE:  BP (!) 92/54   Pulse (!) 107   Temp 98.8 F (37.1 C) (Oral)   Resp 15   Ht 5\' 7"  (1.702 m)   Wt 83 kg   LMP 07/08/2017 (Approximate)   SpO2 100%   Breastfeeding? Unknown   BMI 28.66 kg/m  No intake/output data recorded.  She has shown cervical change. CERVIX: per RN exam  CONTRACTIONS: regular, every 2-3 minutes FHR: Fetal heart tracing reviewed. Baseline: 135 bpm, Variability: Good {> 6 bpm), Accelerations: variable decelerations present after epidural placement and Decelerations: variable Category II   Analgesia: Epidural  Labs: Lab Results  Component Value Date   WBC 9.0 04/24/2018   HGB 10.5 (L) 04/24/2018   HCT 33.9 (L) 04/24/2018   MCV 75.2 (L) 04/24/2018   PLT 148 (L) 04/24/2018    ASSESSMENT: 1) Labor curve reviewed.       Progress: Active phase labor.     Membranes: ruptured, clear fluid           2) pitocin turned off, IV fluid bolus, maternal position change, O2 via face mask.    Active Problems:   Labor and delivery, indication for care   PLAN: IV fluid bolus, change maternal position, D/C stimulation and administer tocolytic if no resolution of variable decels.   Allison [name], CNM  04/24/2018 7:18 PM

## 2018-04-24 NOTE — Anesthesia Preprocedure Evaluation (Signed)
Anesthesia Evaluation  Patient identified by MRN, date of birth, ID band Patient awake    Reviewed: Allergy & Precautions, H&P , NPO status , Patient's Chart, lab work & pertinent test results  History of Anesthesia Complications Negative for: history of anesthetic complications  Airway Mallampati: III  TM Distance: >3 FB Neck ROM: full    Dental  (+) Chipped   Pulmonary asthma ,           Cardiovascular Exercise Tolerance: Good (-) hypertensionnegative cardio ROS       Neuro/Psych    GI/Hepatic negative GI ROS,   Endo/Other    Renal/GU   negative genitourinary   Musculoskeletal   Abdominal   Peds  Hematology negative hematology ROS (+)   Anesthesia Other Findings Past Medical History: No date: Anemia No date: Asthma     Comment:  exercise induced, inhaler use 8 years ago  Past Surgical History: No date: WISDOM TOOTH EXTRACTION  BMI    Body Mass Index:  28.66 kg/m      Reproductive/Obstetrics (+) Pregnancy                             Anesthesia Physical Anesthesia Plan  ASA: III  Anesthesia Plan: Epidural   Post-op Pain Management:    Induction:   PONV Risk Score and Plan:   Airway Management Planned:   Additional Equipment:   Intra-op Plan:   Post-operative Plan:   Informed Consent: I have reviewed the patients History and Physical, chart, labs and discussed the procedure including the risks, benefits and alternatives for the proposed anesthesia with the patient or authorized representative who has indicated his/her understanding and acceptance.     Plan Discussed with: Anesthesiologist  Anesthesia Plan Comments:         Anesthesia Quick Evaluation

## 2018-04-24 NOTE — Progress Notes (Signed)
LABOR NOTE   Allison [name] 27 y.o.@ at 3271w2d Active phase labor.  SUBJECTIVE:  Comfortable with epidural . No urge to push.  OBJECTIVE:  BP (!) 121/48 (BP Location: Left Arm)   Pulse (!) 107   Temp 98.8 F (37.1 C) (Oral)   Resp 18   Ht 5\' 7"  (1.702 m)   Wt 83 kg   LMP 07/08/2017 (Approximate)   SpO2 100%   Breastfeeding? Unknown   BMI 28.66 kg/m  No intake/output data recorded.  She has shown cervical change. CERVIX: paper thin:  100%:   -2:   anterior:   soft SVE:   Dilation: 9 Effacement (%): 90, 100 Station: -2 Exam by:: Janee Mornhompson, CNM CONTRACTIONS: regular, every 2-3 minutes FHR: Fetal heart tracing reviewed. Baseline: 150 bpm, Variability: Good {> 6 bpm), Accelerations: Non-reactive but appropriate for gestational age and Decelerations: variable Category II   Analgesia: Epidural  Labs: Lab Results  Component Value Date   WBC 9.0 04/24/2018   HGB 10.5 (L) 04/24/2018   HCT 33.9 (L) 04/24/2018   MCV 75.2 (L) 04/24/2018   PLT 148 (L) 04/24/2018    ASSESSMENT: 1) Labor curve reviewed.       Progress: Active phase labor.     Membranes: ruptured, clear fluid          2)  Rapid cervical change, Dr. Logan BoresEvans consulted on strip   Active Problems:   Labor and delivery, indication for care   PLAN: maternal oxygen administration, IV fluid bolus, change maternal position and anticipate vaginal delivery   [name], CNM  04/24/2018 7:24 PM

## 2018-04-25 LAB — CBC
HCT: 33.4 % — ABNORMAL LOW (ref 36.0–46.0)
Hemoglobin: 10.7 g/dL — ABNORMAL LOW (ref 12.0–15.0)
MCH: 23.7 pg — ABNORMAL LOW (ref 26.0–34.0)
MCHC: 32 g/dL (ref 30.0–36.0)
MCV: 74.1 fL — AB (ref 80.0–100.0)
NRBC: 0 % (ref 0.0–0.2)
PLATELETS: 141 10*3/uL — AB (ref 150–400)
RBC: 4.51 MIL/uL (ref 3.87–5.11)
RDW: 15.1 % (ref 11.5–15.5)
WBC: 21.8 10*3/uL — AB (ref 4.0–10.5)

## 2018-04-25 LAB — RPR: RPR Ser Ql: NONREACTIVE

## 2018-04-25 MED ORDER — IBUPROFEN 600 MG PO TABS: 600.0000 mg | ORAL_TABLET | Freq: Four times a day (QID) | ORAL | 0 refills | Status: DC

## 2018-04-25 MED ORDER — DOCUSATE SODIUM 100 MG PO CAPS: 100.0000 mg | ORAL_CAPSULE | Freq: Two times a day (BID) | ORAL | 0 refills | Status: DC

## 2018-04-25 NOTE — Progress Notes (Signed)
04/25/2018   BP 99/74 (BP Location: Right Arm)   Pulse 82   Temp 98 F (36.7 C) (Oral)   Resp 20   Ht 5\' 7"  (170.2 cm)   Wt 83 kg   LMP 07/08/2017 (Approximate)   SpO2 100%   Breastfeeding? Unknown   BMI 28.66 kg/m  Patient discharged per MD orders. Discharge instructions reviewed with patient and patient verbalized understanding.  Prescriptions discussed and given to patient. Discharged via wheelchair escorted by nursing staff.  Ron ParkerHerron, Bevin Mayall D, RN

## 2018-04-25 NOTE — Discharge Summary (Signed)
                            Discharge Summary  Date of Admission: 04/24/2018  Date of Discharge: 04/25/2018  Admitting Diagnosis: Onset of Labor at 1481w2d  Mode of Delivery: normal spontaneous vaginal delivery                 Discharge Diagnosis: No other diagnosis   Intrapartum Procedures: epidural   Post partum procedures: none  Complications: none                      Discharge Day SOAP Note:  Progress Note - Vaginal Delivery  Allison [name] is a 27 y.o. G2P2002 now PP day 1 s/p Vaginal, Spontaneous . Delivery was uncomplicated  Subjective  The patient has the following complaints: has no unusual complaints  Pain is controlled with current medications.   Patient is urinating without difficulty.  She is ambulating well.     Objective  Vital signs: BP 96/70 (BP Location: Right Arm)   Pulse 84   Temp 97.9 F (36.6 C) (Oral)   Resp 18   Ht 5\' 7"  (1.702 m)   Wt 83 kg   LMP 07/08/2017 (Approximate)   SpO2 100%   Breastfeeding? Unknown   BMI 28.66 kg/m   Physical Exam: Gen: NAD Fundus Fundal Tone: Firm  Lochia Amount: Small  Perineum Appearance: Intact     Data Review Labs: CBC Latest Ref Rng & Units 04/25/2018 04/24/2018 03/13/2018  WBC 4.0 - 10.5 K/uL 21.8(H) 9.0 7.3  Hemoglobin 12.0 - 15.0 g/dL 10.7(L) 10.5(L) 9.3(L)  Hematocrit 36.0 - 46.0 % 33.4(L) 33.9(L) 30.0(L)  Platelets 150 - 400 K/uL 141(L) 148(L) 190   O POS  Assessment/Plan  Active Problems:   Labor and delivery, indication for care    Plan for discharge today.   Discharge Instructions: Per After Visit Summary. Activity: Advance as tolerated. Pelvic rest for 6 weeks.  Also refer to After Visit Summary Diet: Regular Medications: Allergies as of 04/25/2018      Reactions   Dust Mite Extract Rash   Tree Extract Rash      Medication List    TAKE these medications   docusate sodium 100 MG capsule Commonly known as:  COLACE Take 1 capsule (100 mg total) by mouth 2 (two)  times daily.   ferrous sulfate 325 (65 FE) MG EC tablet Take 325 mg by mouth daily with breakfast.   ibuprofen 600 MG tablet Commonly known as:  ADVIL,MOTRIN Take 1 tablet (600 mg total) by mouth every 6 (six) hours.   prenatal multivitamin Tabs tablet Take 1 tablet by mouth daily at 12 noon.      Outpatient follow up: 6 wks with Doreene BurkeAnnie Vivien Barretto, CNM @ [facility]  Postpartum contraception: She is unsure at this time will discuss @ 6 wk visit.   Discharged Condition: good  Discharged to: home  Newborn Data: Disposition:home with mother  Apgars: APGAR (1 MIN): 8   APGAR (5 MINS): 9   APGAR (10 MINS):    Baby Feeding: Breast    Doreene Burkennie Kali Deadwyler, CNM  04/25/2018 11:15 AM

## 2018-04-25 NOTE — Lactation Note (Signed)
Lactation Consultation Note  Patient Name: Allison [name] ZOXWR'UToday's Date: 04/25/2018 Reason for consult: Initial assessment   Maternal Data  Mother has a compression stripe bilaterally on the nipple. The latch does nook goodd with the lips rolled out and tongue under the nipple. We worked on position and latch and she is using coconut oil for the nipples.  Feeding Feeding Type: Breast Fed  LATCH Score Latch: Grasps breast easily, tongue down, lips flanged, rhythmical sucking.  Audible Swallowing: Spontaneous and intermittent  Type of Nipple: Everted at rest and after stimulation  Comfort (Breast/Nipple): Engorged, cracked, bleeding, large blisters, severe discomfort  Hold (Positioning): Assistance needed to correctly position infant at breast and maintain latch.  LATCH Score: 7  Interventions Interventions: Breast feeding basics reviewed  Lactation Tools Discussed/Used     Consult Status Consult Status: Follow-up Follow-up type: In-patient    Trudee GripCarolyn P Sahid Borba 04/25/2018, 10:55 AM

## 2018-04-25 NOTE — Anesthesia Postprocedure Evaluation (Signed)
Anesthesia Post Note  Patient: Allison [name]  Procedure(s) Performed: AN AD HOC LABOR EPIDURAL  Patient location during evaluation: Mother Baby Anesthesia Type: Epidural Level of consciousness: awake and alert and oriented Pain management: pain level controlled Vital Signs Assessment: post-procedure vital signs reviewed and stable Respiratory status: spontaneous breathing Cardiovascular status: blood pressure returned to baseline Postop Assessment: no headache and no backache Anesthetic complications: no     Last Vitals:  Vitals:   04/25/18 0315 04/25/18 0755  BP: 107/71 96/70  Pulse: 96 84  Resp: 18 18  Temp: 36.7 C 36.6 C  SpO2: 100% 100%    Last Pain:  Vitals:   04/25/18 0928  TempSrc:   PainSc: 0-No pain                 [name]

## 2018-04-25 NOTE — Final Progress Note (Signed)
Discharge Day SOAP Note:  Progress Note - Vaginal Delivery  Allison [name] is a [age] y.o. G2P2002 now PP day 1 s/p Vaginal, Spontaneous . Delivery was uncomplicated  Subjective  The patient has the following complaints: has no unusual complaints  Pain is controlled with current medications.   Patient is urinating without difficulty.  She is ambulating well.     Objective  Vital signs: BP 96/70 (BP Location: Right Arm)   Pulse 84   Temp 97.9 F (36.6 C) (Oral)   Resp 18   Ht 5\' 7"  (1.702 m)   Wt 83 kg   LMP 07/08/2017 (Approximate)   SpO2 100%   Breastfeeding? Unknown   BMI 28.66 kg/m   Physical Exam: Gen: NAD Fundus Fundal Tone: Firm  Lochia Amount: Small  Perineum Appearance: Intact     Data Review Labs: CBC Latest Ref Rng & Units 04/25/2018 04/24/2018 03/13/2018  WBC 4.0 - 10.5 K/uL 21.8(H) 9.0 7.3  Hemoglobin 12.0 - 15.0 g/dL 10.7(L) 10.5(L) 9.3(L)  Hematocrit 36.0 - 46.0 % 33.4(L) 33.9(L) 30.0(L)  Platelets 150 - 400 K/uL 141(L) 148(L) 190   O POS  Assessment/Plan  Active Problems:   Labor and delivery, indication for care    Plan for discharge today.   Discharge Instructions: Per After Visit Summary. Activity: Advance as tolerated. Pelvic rest for 6 weeks.  Also refer to After Visit Summary Diet: Regular Medications: Allergies as of 04/25/2018      Reactions   Dust Mite Extract Rash   Tree Extract Rash      Medication List    TAKE these medications   docusate sodium 100 MG capsule Commonly known as:  COLACE Take 1 capsule (100 mg total) by mouth 2 (two) times daily.   ferrous sulfate 325 (65 FE) MG EC tablet Take 325 mg by mouth daily with breakfast.   ibuprofen 600 MG tablet Commonly known as:  ADVIL,MOTRIN Take 1 tablet (600 mg total) by mouth every 6 (six) hours.   prenatal multivitamin Tabs tablet Take 1 tablet by mouth daily at 12 noon.      Outpatient follow up: 6 wks with Allison BurkeAnnie Mella [name], CNM @ [facility]  Postpartum  contraception: She is unsure at this time will discuss @ 6 wk visit.   Discharged Condition: good  Discharged to: home  Newborn Data: Disposition:home with mother  Apgars: APGAR (1 MIN): 8   APGAR (5 MINS): 9   APGAR (10 MINS):    Baby Feeding: Breast    Allison [name], CNM  04/25/2018 11:15 AM

## 2018-05-14 ENCOUNTER — Inpatient Hospital Stay: Payer: Medicaid Other | Attending: Oncology

## 2018-05-14 ENCOUNTER — Other Ambulatory Visit: Payer: Self-pay | Admitting: *Deleted

## 2018-05-14 DIAGNOSIS — D508 Other iron deficiency anemias: Secondary | ICD-10-CM | POA: Insufficient documentation

## 2018-05-14 DIAGNOSIS — D509 Iron deficiency anemia, unspecified: Secondary | ICD-10-CM

## 2018-05-14 LAB — IRON AND TIBC
IRON: 95 ug/dL (ref 28–170)
Saturation Ratios: 29 % (ref 10.4–31.8)
TIBC: 324 ug/dL (ref 250–450)
UIBC: 229 ug/dL

## 2018-05-14 LAB — CBC WITH DIFFERENTIAL/PLATELET
ABS IMMATURE GRANULOCYTES: 0.01 10*3/uL (ref 0.00–0.07)
BASOS ABS: 0.1 10*3/uL (ref 0.0–0.1)
BASOS PCT: 2 %
Eosinophils Absolute: 0.2 10*3/uL (ref 0.0–0.5)
Eosinophils Relative: 4 %
HCT: 40.1 % (ref 36.0–46.0)
Hemoglobin: 12.3 g/dL (ref 12.0–15.0)
Immature Granulocytes: 0 %
Lymphocytes Relative: 51 %
Lymphs Abs: 1.8 10*3/uL (ref 0.7–4.0)
MCH: 23 pg — ABNORMAL LOW (ref 26.0–34.0)
MCHC: 30.7 g/dL (ref 30.0–36.0)
MCV: 75.1 fL — ABNORMAL LOW (ref 80.0–100.0)
Monocytes Absolute: 0.3 10*3/uL (ref 0.1–1.0)
Monocytes Relative: 9 %
NEUTROS ABS: 1.2 10*3/uL — AB (ref 1.7–7.7)
NEUTROS PCT: 34 %
NRBC: 0 % (ref 0.0–0.2)
PLATELETS: 291 10*3/uL (ref 150–400)
RBC: 5.34 MIL/uL — AB (ref 3.87–5.11)
RDW: 14 % (ref 11.5–15.5)
WBC: 3.6 10*3/uL — ABNORMAL LOW (ref 4.0–10.5)

## 2018-05-14 LAB — FERRITIN: Ferritin: 165 ng/mL (ref 11–307)

## 2018-05-15 ENCOUNTER — Inpatient Hospital Stay: Payer: Medicaid Other | Admitting: Oncology

## 2018-05-15 ENCOUNTER — Inpatient Hospital Stay: Payer: Medicaid Other

## 2018-06-03 ENCOUNTER — Ambulatory Visit (INDEPENDENT_AMBULATORY_CARE_PROVIDER_SITE_OTHER): Payer: Managed Care, Other (non HMO) | Admitting: Certified Nurse Midwife

## 2018-06-03 ENCOUNTER — Encounter: Payer: Self-pay | Admitting: Certified Nurse Midwife

## 2018-06-03 NOTE — Progress Notes (Signed)
Subjective:    Allison [name] is a 28 y.o. G25P2002 African American female who presents for a postpartum visit. She is 6 weeks postpartum following a spontaneous vaginal delivery at 40.2 gestational weeks. Anesthesia: epidural. I have fully reviewed the prenatal and intrapartum course. Postpartum course has been WNL. Baby's course has been WNL. Baby is feeding by bottle. Bleeding started period. Bowel function is normal. Bladder function is normal. Patient is sexually active. Last sexual activity: last week. Contraception method is none. Postpartum depression screening: negative. Score 0.  Last pap 2018 and was negative.  The following portions of the patient's history were reviewed and updated as appropriate: allergies, current medications, past medical history, past surgical history and problem list.  Review of Systems Pertinent items are noted in HPI.   Vitals:   06/03/18 0937  BP: 100/63  Pulse: 73  Weight: 155 lb 2 oz (70.4 kg)  Height: 5\' 7"  (1.702 m)   Patient's last menstrual period was 06/02/2018 (exact date).  Objective:   General:  alert, cooperative and no distress   Breasts:  deferred, no complaints  Lungs: clear to auscultation bilaterally  Heart:  regular rate and rhythm  Abdomen: soft, nontender   Vulva: normal  Vagina: normal vagina  Cervix:  closed  Corpus: Well-involuted  Adnexa:  Non-palpable  Rectal Exam: No hemorrhoids        Assessment:   Postpartum exam 6 wks s/p SVD Bottle feeding Depression screening Contraception counseling   Plan:  : none She is thinking about starting the pill. She used Lo lo estrin in the past. Sample pack given with instructions on use. She denies any contraindications to pill use.  Follow up in: 6 months for annual or earlier if needed  Doreene Burke, CNM

## 2018-06-03 NOTE — Patient Instructions (Signed)
Preventive Care 18-39 Years, Female Preventive care refers to lifestyle choices and visits with your health care provider that can promote health and wellness. What does preventive care include?   A yearly physical exam. This is also called an annual well check.  Dental exams once or twice a year.  Routine eye exams. Ask your health care provider how often you should have your eyes checked.  Personal lifestyle choices, including: ? Daily care of your teeth and gums. ? Regular physical activity. ? Eating a healthy diet. ? Avoiding tobacco and drug use. ? Limiting alcohol use. ? Practicing safe sex. ? Taking vitamin and mineral supplements as recommended by your health care provider. What happens during an annual well check? The services and screenings done by your health care provider during your annual well check will depend on your age, overall health, lifestyle risk factors, and family history of disease. Counseling Your health care provider may ask you questions about your:  Alcohol use.  Tobacco use.  Drug use.  Emotional well-being.  Home and relationship well-being.  Sexual activity.  Eating habits.  Work and work environment.  Method of birth control.  Menstrual cycle.  Pregnancy history. Screening You may have the following tests or measurements:  Height, weight, and BMI.  Diabetes screening. This is done by checking your blood sugar (glucose) after you have not eaten for a while (fasting).  Blood pressure.  Lipid and cholesterol levels. These may be checked every 5 years starting at age 20.  Skin check.  Hepatitis C blood test.  Hepatitis B blood test.  Sexually transmitted disease (STD) testing.  BRCA-related cancer screening. This may be done if you have a family history of breast, ovarian, tubal, or peritoneal cancers.  Pelvic exam and Pap test. This may be done every 3 years starting at age 21. Starting at age 30, this may be done every 5  years if you have a Pap test in combination with an HPV test. Discuss your test results, treatment options, and if necessary, the need for more tests with your health care provider. Vaccines Your health care provider may recommend certain vaccines, such as:  Influenza vaccine. This is recommended every year.  Tetanus, diphtheria, and acellular pertussis (Tdap, Td) vaccine. You may need a Td booster every 10 years.  Varicella vaccine. You may need this if you have not been vaccinated.  HPV vaccine. If you are 26 or younger, you may need three doses over 6 months.  Measles, mumps, and rubella (MMR) vaccine. You may need at least one dose of MMR. You may also need a second dose.  Pneumococcal 13-valent conjugate (PCV13) vaccine. You may need this if you have certain conditions and were not previously vaccinated.  Pneumococcal polysaccharide (PPSV23) vaccine. You may need one or two doses if you smoke cigarettes or if you have certain conditions.  Meningococcal vaccine. One dose is recommended if you are age 19-21 years and a first-year college student living in a residence hall, or if you have one of several medical conditions. You may also need additional booster doses.  Hepatitis A vaccine. You may need this if you have certain conditions or if you travel or work in places where you may be exposed to hepatitis A.  Hepatitis B vaccine. You may need this if you have certain conditions or if you travel or work in places where you may be exposed to hepatitis B.  Haemophilus influenzae type b (Hib) vaccine. You may need this if you   have certain risk factors. Talk to your health care provider about which screenings and vaccines you need and how often you need them. This information is not intended to replace advice given to you by your health care provider. Make sure you discuss any questions you have with your health care provider. Document Released: 07/11/2001 Document Revised: 12/26/2016  Document Reviewed: 03/16/2015 Elsevier Interactive Patient Education  2019 [employer].

## 2018-06-17 ENCOUNTER — Encounter: Payer: Medicaid Other | Admitting: Certified Nurse Midwife

## 2018-08-09 ENCOUNTER — Other Ambulatory Visit: Payer: Self-pay

## 2018-08-09 DIAGNOSIS — D508 Other iron deficiency anemias: Secondary | ICD-10-CM

## 2018-08-12 ENCOUNTER — Telehealth: Payer: Self-pay | Admitting: *Deleted

## 2018-08-12 NOTE — Telephone Encounter (Signed)
Called patient to make her aware of her NEW scheduled 09/09/18 lab and 09/10/18 MD/Infusion appt. (In an effort to prevent the possible spread of infection to our patients, communities and staff we need to R/S patient's upcoming appts) Appt was R/S as requested. A detailed message was left on pts. vmail.

## 2018-08-14 ENCOUNTER — Inpatient Hospital Stay: Payer: Medicaid Other

## 2018-08-16 ENCOUNTER — Inpatient Hospital Stay: Payer: Medicaid Other | Admitting: Oncology

## 2018-08-16 ENCOUNTER — Inpatient Hospital Stay: Payer: Medicaid Other

## 2018-09-08 ENCOUNTER — Other Ambulatory Visit: Payer: Self-pay

## 2018-09-09 ENCOUNTER — Inpatient Hospital Stay: Payer: Managed Care, Other (non HMO) | Attending: Oncology

## 2018-09-09 ENCOUNTER — Other Ambulatory Visit: Payer: Self-pay

## 2018-09-09 DIAGNOSIS — D508 Other iron deficiency anemias: Secondary | ICD-10-CM | POA: Insufficient documentation

## 2018-09-09 LAB — CBC WITH DIFFERENTIAL/PLATELET
Abs Immature Granulocytes: 0.01 10*3/uL (ref 0.00–0.07)
Basophils Absolute: 0.1 10*3/uL (ref 0.0–0.1)
Basophils Relative: 1 %
Eosinophils Absolute: 0.1 10*3/uL (ref 0.0–0.5)
Eosinophils Relative: 2 %
HCT: 36.6 % (ref 36.0–46.0)
Hemoglobin: 11.3 g/dL — ABNORMAL LOW (ref 12.0–15.0)
Immature Granulocytes: 0 %
Lymphocytes Relative: 39 %
Lymphs Abs: 1.9 10*3/uL (ref 0.7–4.0)
MCH: 22.7 pg — ABNORMAL LOW (ref 26.0–34.0)
MCHC: 30.9 g/dL (ref 30.0–36.0)
MCV: 73.6 fL — ABNORMAL LOW (ref 80.0–100.0)
Monocytes Absolute: 0.4 10*3/uL (ref 0.1–1.0)
Monocytes Relative: 8 %
Neutro Abs: 2.4 10*3/uL (ref 1.7–7.7)
Neutrophils Relative %: 50 %
Platelets: 302 10*3/uL (ref 150–400)
RBC: 4.97 MIL/uL (ref 3.87–5.11)
RDW: 14.6 % (ref 11.5–15.5)
WBC: 4.8 10*3/uL (ref 4.0–10.5)
nRBC: 0 % (ref 0.0–0.2)

## 2018-09-09 LAB — IRON AND TIBC
Iron: 95 ug/dL (ref 28–170)
Saturation Ratios: 26 % (ref 10.4–31.8)
TIBC: 359 ug/dL (ref 250–450)
UIBC: 264 ug/dL

## 2018-09-09 LAB — FERRITIN: Ferritin: 95 ng/mL (ref 11–307)

## 2018-09-10 ENCOUNTER — Encounter: Payer: Self-pay | Admitting: Oncology

## 2018-09-10 ENCOUNTER — Other Ambulatory Visit: Payer: Self-pay

## 2018-09-10 ENCOUNTER — Inpatient Hospital Stay: Payer: Managed Care, Other (non HMO)

## 2018-09-10 ENCOUNTER — Inpatient Hospital Stay: Payer: Managed Care, Other (non HMO) | Admitting: Oncology

## 2018-09-10 ENCOUNTER — Inpatient Hospital Stay (HOSPITAL_BASED_OUTPATIENT_CLINIC_OR_DEPARTMENT_OTHER): Payer: Managed Care, Other (non HMO) | Admitting: Oncology

## 2018-09-10 DIAGNOSIS — Z79899 Other long term (current) drug therapy: Secondary | ICD-10-CM

## 2018-09-10 DIAGNOSIS — D509 Iron deficiency anemia, unspecified: Secondary | ICD-10-CM

## 2018-09-10 DIAGNOSIS — D508 Other iron deficiency anemias: Secondary | ICD-10-CM | POA: Diagnosis not present

## 2018-09-10 MED ORDER — IRON-VITAMIN C 65-125 MG PO TABS: 1.0000 | ORAL_TABLET | Freq: Every day | ORAL | 0 refills | Status: DC

## 2018-09-10 NOTE — Progress Notes (Signed)
Called patient for WebEx visit.  Patient states no new concerns today.   

## 2018-09-10 NOTE — Progress Notes (Addendum)
HEMATOLOGY-ONCOLOGY TeleHEALTH VISIT PROGRESS NOTE  I connected with Allison CruelKourtney L [name] on 09/10/18 at  9:00 AM EDT by video enabled telemedicine visit and verified that I am speaking with the correct person using two identifiers. I discussed the limitations, risks, security and privacy concerns of performing an evaluation and management service by telemedicine and the availability of in-person appointments. I also discussed with the patient that there may be a patient responsible charge related to this service. The patient expressed understanding and agreed to proceed.   Other persons participating in the visit and their role in the encounter:  Diamantina MonksJulie Eastwood, CMA, check in patient      Patient's location: Home  Provider's location: home  Chief Complaint: follow up for iron deficiency anemia.    INTERVAL HISTORY Allison [name] is a 28 y.o. female who has above history reviewed by me today presents for follow up visit for management of iron deficiency anemia.  Problems and complaints are listed below:  She was seen by me on 03/14/2019 for iron deficiency anemia during her third trimester.  She received IV Venofer weekly x 4.  During the interval, she has delivered. Bottle feeding.  Menstrual period has resumed and reports normal flow, not heavy.  Fatigue has significantly improved.   Review of Systems  Constitutional: Negative for appetite change, chills, fatigue and fever.  HENT:   Negative for hearing loss and voice change.   Eyes: Negative for eye problems.  Respiratory: Negative for chest tightness and cough.   Cardiovascular: Negative for chest pain.  Gastrointestinal: Negative for abdominal distention, abdominal pain and blood in stool.  Endocrine: Negative for hot flashes.  Genitourinary: Negative for difficulty urinating and frequency.   Musculoskeletal: Negative for arthralgias.  Skin: Negative for itching and rash.  Neurological: Negative for extremity weakness.   Hematological: Negative for adenopathy.  Psychiatric/Behavioral: Negative for confusion.    Past Medical History:  Diagnosis Date  . Anemia   . Asthma    exercise induced, inhaler use 8 years ago   Past Surgical History:  Procedure Laterality Date  . WISDOM TOOTH EXTRACTION      Family History  Problem Relation Age of Onset  . Breast cancer Other   . Ovarian cancer Neg Hx   . Colon cancer Neg Hx   . Diabetes Neg Hx     Social History   Socioeconomic History  . Marital status: Single    Spouse name: Not on file  . Number of children: Not on file  . Years of education: Not on file  . Highest education level: Not on file  Occupational History    Employer: [employer]  Social Needs  . Financial resource strain: Not hard at all  . Food insecurity:    Worry: Never true    Inability: Never true  . Transportation needs:    Medical: No    Non-medical: No  Tobacco Use  . Smoking status: Never Smoker  . Smokeless tobacco: Never Used  Substance and Sexual Activity  . Alcohol use: No  . Drug use: No  . Sexual activity: Yes    Birth control/protection: None    Comment: undecided  Lifestyle  . Physical activity:    Days per week: 3 days    Minutes per session: Not on file  . Stress: Not at all  Relationships  . Social connections:    Talks on phone: Three times a week    Gets together: Three times a week  Attends religious service: 1 to 4 times per year    Active member of club or organization: No    Attends meetings of clubs or organizations: Never    Relationship status: Living with partner  . Intimate partner violence:    Fear of current or ex partner: Not on file    Emotionally abused: Not on file    Physically abused: Not on file    Forced sexual activity: Not on file  Other Topics Concern  . Not on file  Social History Narrative  . Not on file    No current outpatient medications on file prior to visit.   No current facility-administered  medications on file prior to visit.     Allergies  Allergen Reactions  . Dust Mite Extract Rash  . Tree Extract Rash       Observations/Objective: There were no vitals filed for this visit. There is no height or weight on file to calculate BMI.  Physical Exam  Constitutional: She is oriented to person, place, and time and well-developed, well-nourished, and in no distress. No distress.  HENT:  Head: Normocephalic and atraumatic.  Eyes: EOM are normal.  Neck: Normal range of motion.  Pulmonary/Chest: Effort normal. No respiratory distress.  Neurological: She is alert and oriented to person, place, and time.  Psychiatric: Affect normal.   CBC    Component Value Date/Time   WBC 4.8 09/09/2018 1321   RBC 4.97 09/09/2018 1321   HGB 11.3 (L) 09/09/2018 1321   HGB 8.3 (L) 02/28/2018 1630   HCT 36.6 09/09/2018 1321   HCT 27.5 (L) 02/28/2018 1630   PLT 302 09/09/2018 1321   PLT 163 02/28/2018 1630   MCV 73.6 (L) 09/09/2018 1321   MCV 76 (L) 02/28/2018 1630   MCH 22.7 (L) 09/09/2018 1321   MCHC 30.9 09/09/2018 1321   RDW 14.6 09/09/2018 1321   RDW 14.9 02/28/2018 1630   LYMPHSABS 1.9 09/09/2018 1321   LYMPHSABS 2.0 09/21/2017 1521   MONOABS 0.4 09/09/2018 1321   EOSABS 0.1 09/09/2018 1321   EOSABS 0.1 09/21/2017 1521   BASOSABS 0.1 09/09/2018 1321   BASOSABS 0.0 09/21/2017 1521    CMP     Component Value Date/Time   NA 137 05/14/2015 1159   K 3.5 05/14/2015 1159   CL 105 05/14/2015 1159   CO2 24 05/14/2015 1159   GLUCOSE 90 05/14/2015 1159   BUN 5 (L) 05/14/2015 1159   CREATININE 0.52 05/14/2015 1159   CALCIUM 8.9 05/14/2015 1159   PROT 7.9 05/14/2015 1159   ALBUMIN 4.2 05/14/2015 1159   AST 16 05/14/2015 1159   ALT 11 (L) 05/14/2015 1159   ALKPHOS 57 05/14/2015 1159   BILITOT 0.9 05/14/2015 1159   GFRNONAA >60 05/14/2015 1159   GFRAA >60 05/14/2015 1159     Assessment and Plan: 1. Other iron deficiency anemia   2. Microcytic anemia     Labs are reviewed  and discussed with patient. Hemoglobin 11.3 close to her baseline. MCV is chronically low, normal RDW.  Iron panel was reviewed, normal iron store.  Will hold additional IV iron.  She does not need to take oral iron supplementation.  I recommend checking hemoglobinopathy evaluation at next visit.   Follow Up Instructions: Lab, MD assessment in 3 months.   I discussed the assessment and treatment plan with the patient. The patient was provided an opportunity to ask questions and all were answered. The patient agreed with the plan and demonstrated an understanding of  the instructions.  The patient was advised to call back or seek an in-person evaluation if the symptoms worsen or if the condition fails to improve as anticipated.  Orders Placed This Encounter  Procedures  . CBC with Differential/Platelet    Standing Status:   Future    Standing Expiration Date:   09/10/2019  . Ferritin    Standing Status:   Future    Standing Expiration Date:   09/10/2019  . Iron and TIBC    Standing Status:   Future    Standing Expiration Date:   09/10/2019  . Hemoglobinopathy evaluation    Standing Status:   Future    Standing Expiration Date:   09/10/2019     I provided 16 minutes of face-to-face video visit time during this encounter, and > 50% was spent counseling as documented under my assessment & plan.  Rickard Patience, MD 09/10/2018 3:44 PM

## 2018-11-28 ENCOUNTER — Telehealth: Payer: Self-pay

## 2018-11-28 NOTE — Telephone Encounter (Signed)
Called patient to pre-screen before 7/6 OV.  Patient c/o cough x1 week.  Advised patient she needs to see PCP or go to Urgent Care to r/o COVID per CM.  Patient to call back and reschedule once cleared.  Patient verbalized understanding.

## 2018-12-02 ENCOUNTER — Encounter: Payer: Managed Care, Other (non HMO) | Admitting: Certified Nurse Midwife

## 2018-12-09 ENCOUNTER — Inpatient Hospital Stay: Payer: Managed Care, Other (non HMO)

## 2018-12-10 ENCOUNTER — Inpatient Hospital Stay: Payer: Managed Care, Other (non HMO) | Admitting: Oncology

## 2018-12-17 ENCOUNTER — Inpatient Hospital Stay: Payer: Managed Care, Other (non HMO) | Attending: Oncology

## 2018-12-19 ENCOUNTER — Inpatient Hospital Stay: Payer: Managed Care, Other (non HMO) | Admitting: Oncology

## 2018-12-19 ENCOUNTER — Other Ambulatory Visit: Payer: Self-pay | Admitting: Oncology

## 2018-12-19 DIAGNOSIS — D508 Other iron deficiency anemias: Secondary | ICD-10-CM

## 2019-01-13 ENCOUNTER — Other Ambulatory Visit: Payer: Self-pay

## 2019-01-13 DIAGNOSIS — Z20822 Contact with and (suspected) exposure to covid-19: Secondary | ICD-10-CM

## 2019-01-15 LAB — NOVEL CORONAVIRUS, NAA: SARS-CoV-2, NAA: NOT DETECTED

## 2019-01-17 ENCOUNTER — Other Ambulatory Visit: Payer: Self-pay

## 2019-01-17 DIAGNOSIS — Z20822 Contact with and (suspected) exposure to covid-19: Secondary | ICD-10-CM

## 2019-01-18 LAB — NOVEL CORONAVIRUS, NAA: SARS-CoV-2, NAA: NOT DETECTED

## 2019-01-24 ENCOUNTER — Other Ambulatory Visit: Payer: Self-pay

## 2019-01-24 DIAGNOSIS — Z20822 Contact with and (suspected) exposure to covid-19: Secondary | ICD-10-CM

## 2019-01-26 LAB — NOVEL CORONAVIRUS, NAA: SARS-CoV-2, NAA: NOT DETECTED

## 2019-04-10 ENCOUNTER — Emergency Department
Admission: EM | Admit: 2019-04-10 | Discharge: 2019-04-10 | Disposition: A | Discharge status: Home / Self Care | Payer: Managed Care, Other (non HMO) | Attending: Emergency Medicine | Admitting: Emergency Medicine

## 2019-04-10 ENCOUNTER — Other Ambulatory Visit: Payer: Self-pay

## 2019-04-10 ENCOUNTER — Encounter: Payer: Self-pay | Admitting: Emergency Medicine

## 2019-04-10 DIAGNOSIS — R102 Pelvic and perineal pain: Secondary | ICD-10-CM | POA: Diagnosis present

## 2019-04-10 DIAGNOSIS — Z79899 Other long term (current) drug therapy: Secondary | ICD-10-CM | POA: Diagnosis not present

## 2019-04-10 DIAGNOSIS — N946 Dysmenorrhea, unspecified: Secondary | ICD-10-CM | POA: Diagnosis not present

## 2019-04-10 DIAGNOSIS — J45909 Unspecified asthma, uncomplicated: Secondary | ICD-10-CM | POA: Insufficient documentation

## 2019-04-10 LAB — URINALYSIS, COMPLETE (UACMP) WITH MICROSCOPIC
Bacteria, UA: NONE SEEN
Glucose, UA: NEGATIVE mg/dL
Hgb urine dipstick: NEGATIVE
Ketones, ur: 20 mg/dL — AB
Leukocytes,Ua: NEGATIVE
Nitrite: NEGATIVE
Protein, ur: 100 mg/dL — AB
Specific Gravity, Urine: 1.044 — ABNORMAL HIGH (ref 1.005–1.030)
pH: 5 (ref 5.0–8.0)

## 2019-04-10 LAB — POCT PREGNANCY, URINE: Preg Test, Ur: NEGATIVE

## 2019-04-10 MED ORDER — KETOROLAC TROMETHAMINE 30 MG/ML IJ SOLN
30.0000 mg | Freq: Once | INTRAMUSCULAR | Status: AC
  Administered 2019-04-10: 30 mg via INTRAMUSCULAR
  Filled 2019-04-10: qty 1

## 2019-04-10 NOTE — ED Triage Notes (Signed)
Pt in via POV, states, "I started my cycle last night and had some severe lower abdominal pain and back pain, then today I have been bleeding a lot and passing clots."  Pt unsure if she is pregnant.  Ambulatory to triage, NAD noted at this time.

## 2019-04-10 NOTE — ED Provider Notes (Signed)
[facility] Emergency Department Provider Note  ____________________________________________  Time seen: Approximately 10:37 PM  I have reviewed the triage vital signs and the nursing notes.   HISTORY  Chief Complaint Pelvic Pain    HPI Allison [name] is a 28 y.o. female presents to the emergency department with menstrual cramps and low back pain that started yesterday when patient started her cycle.  Patient states that she noticed blood clots while urinating and became concerned.  Patient also states that her menstrual cramps are not typically so intense.  Patient states that she really does not have discomfort currently as she took some Excedrin.  No fever or chills at home.  Patient also questions whether or not she could be pregnant.        Past Medical History:  Diagnosis Date  . Anemia   . Asthma    exercise induced, inhaler use 8 years ago    Patient Active Problem List   Diagnosis Date Noted  . Labor and delivery, indication for care 04/24/2018  . Indication for care in labor and delivery, antepartum 04/23/2018  . Iron deficiency anemia 03/13/2018  . Pregnancy 12/06/2017    Past Surgical History:  Procedure Laterality Date  . WISDOM TOOTH EXTRACTION      Prior to Admission medications   Medication Sig Start Date End Date Taking? Authorizing Provider  Iron-Vitamin C 65-125 MG TABS Take 1 tablet by mouth daily. 09/10/18   Rickard Patience, MD    Allergies Dust mite extract and Tree extract  Family History  Problem Relation Age of Onset  . Breast cancer Other   . Ovarian cancer Neg Hx   . Colon cancer Neg Hx   . Diabetes Neg Hx     Social History Social History   Tobacco Use  . Smoking status: Never Smoker  . Smokeless tobacco: Never Used  Substance Use Topics  . Alcohol use: No  . Drug use: No     Review of Systems  Constitutional: No fever/chills Eyes: No visual changes. No discharge ENT: No upper respiratory  complaints. Cardiovascular: no chest pain. Respiratory: no cough. No SOB. Gastrointestinal: No abdominal pain.  No nausea, no vomiting.  No diarrhea.  No constipation.  Patient has menstrual cramps. Genitourinary: Negative for dysuria. No hematuria Musculoskeletal: Negative for musculoskeletal pain. Skin: Negative for rash, abrasions, lacerations, ecchymosis. Neurological: Negative for headaches, focal weakness or numbness.   ____________________________________________   PHYSICAL EXAM:  VITAL SIGNS: ED Triage Vitals  Enc Vitals Group     BP 04/10/19 2148 110/75     Pulse Rate 04/10/19 2148 86     Resp 04/10/19 2148 16     Temp 04/10/19 2148 99.1 F (37.3 C)     Temp Source 04/10/19 2148 Oral     SpO2 04/10/19 2148 99 %     Weight 04/10/19 2149 160 lb (72.6 kg)     Height 04/10/19 2149 5\' 7"  (1.702 m)     Head Circumference --      Peak Flow --      Pain Score 04/10/19 2148 3     Pain Loc --      Pain Edu? --      Excl. in GC? --      Constitutional: Alert and oriented. Well appearing and in no acute distress. Eyes: Conjunctivae are normal. PERRL. EOMI. Head: Atraumatic. ENT: Cardiovascular: Normal rate, regular rhythm. Normal S1 and S2.  Good peripheral circulation. Respiratory: Normal respiratory effort without tachypnea or  retractions. Lungs CTAB. Good air entry to the bases with no decreased or absent breath sounds. Gastrointestinal: Bowel sounds 4 quadrants. Soft and nontender to palpation. No guarding or rigidity. No palpable masses. No distention. No CVA tenderness. Musculoskeletal: Full range of motion to all extremities. No gross deformities appreciated. Neurologic:  Normal speech and language. No gross focal neurologic deficits are appreciated.  Skin:  Skin is warm, dry and intact. No rash noted. Psychiatric: Mood and affect are normal. Speech and behavior are normal. Patient exhibits appropriate insight and  judgement.   ____________________________________________   LABS (all labs ordered are listed, but only abnormal results are displayed)  Labs Reviewed  URINALYSIS, COMPLETE (UACMP) WITH MICROSCOPIC - Abnormal; Notable for the following components:      Result Value   Color, Urine AMBER (*)    APPearance HAZY (*)    Specific Gravity, Urine 1.044 (*)    Bilirubin Urine SMALL (*)    Ketones, ur 20 (*)    Protein, ur 100 (*)    All other components within normal limits  POC URINE PREG, ED  POCT PREGNANCY, URINE   ____________________________________________  EKG   ____________________________________________  RADIOLOGY  No results found.  ____________________________________________    PROCEDURES  Procedure(s) performed:    Procedures    Medications  ketorolac (TORADOL) 30 MG/ML injection 30 mg (has no administration in time range)     ____________________________________________   INITIAL IMPRESSION / ASSESSMENT AND PLAN / ED COURSE  Pertinent labs & imaging results that were available during my care of the patient were reviewed by me and considered in my medical decision making (see chart for details).  Review of the [place] CSRS was performed in accordance of the [place] prior to dispensing any controlled drugs.           Assessment and plan Painful menses 28 year old female presents to the emergency department with intense menstrual cramps and low back pain that started yesterday after patient cycle started.  Patient also noticed blood clots while urinating and became concerned.  I reviewed patient's urinalysis with her and urine pregnancy testing in the emergency department.  She excepted an injection of Toradol for pain.  She was advised to follow-up with primary care as needed.  All patient questions were answered.    ____________________________________________  FINAL CLINICAL IMPRESSION(S) / ED DIAGNOSES  Final diagnoses:  Menses painful       NEW MEDICATIONS STARTED DURING THIS VISIT:  ED Discharge Orders    None          This chart was dictated using voice recognition software/Dragon. Despite best efforts to proofread, errors can occur which can change the meaning. Any change was purely unintentional.    Lannie Fields, PA-C 04/10/19 2240    Duffy Bruce, MD 04/11/19 1059

## 2020-01-12 ENCOUNTER — Ambulatory Visit (INDEPENDENT_AMBULATORY_CARE_PROVIDER_SITE_OTHER): Payer: Self-pay | Admitting: Certified Nurse Midwife

## 2020-01-12 ENCOUNTER — Encounter: Payer: Self-pay | Admitting: Certified Nurse Midwife

## 2020-01-12 VITALS — BP 103/69 | HR 74 | Ht 67.0 in | Wt 172.2 lb

## 2020-01-12 DIAGNOSIS — N920 Excessive and frequent menstruation with regular cycle: Secondary | ICD-10-CM

## 2020-01-12 NOTE — Patient Instructions (Signed)
Abnormal Uterine Bleeding °Abnormal uterine bleeding means bleeding more than usual from your uterus. It can include: °· Bleeding between periods. °· Bleeding after sex. °· Bleeding that is heavier than normal. °· Periods that last longer than usual. °· Bleeding after you have stopped having your period (menopause). °There are many problems that may cause this. You should see a doctor for any kind of bleeding that is not normal. Treatment depends on the cause of the bleeding. °Follow these instructions at home: °· Watch your condition for any changes. °· Do not use tampons, douche, or have sex, if your doctor tells you not to. °· Change your pads often. °· Get regular well-woman exams. Make sure they include a pelvic exam and cervical cancer screening. °· Keep all follow-up visits as told by your doctor. This is important. °Contact a doctor if: °· The bleeding lasts more than one week. °· You feel dizzy at times. °· You feel like you are going to throw up (nauseous). °· You throw up. °Get help right away if: °· You pass out. °· You have to change pads every hour. °· You have belly (abdominal) pain. °· You have a fever. °· You get sweaty. °· You get weak. °· You passing large blood clots from your vagina. °Summary °· Abnormal uterine bleeding means bleeding more than usual from your uterus. °· There are many problems that may cause this. You should see a doctor for any kind of bleeding that is not normal. °· Treatment depends on the cause of the bleeding. °This information is not intended to replace advice given to you by your health care provider. Make sure you discuss any questions you have with your health care provider. °Document Revised: 05/09/2016 Document Reviewed: 05/09/2016 °Elsevier Patient Education © 2020 [employer] ° °

## 2020-01-12 NOTE — Progress Notes (Signed)
GYN ENCOUNTER NOTE  Subjective:       Allison [name] is a 29 y.o. G73P2002 female is here for gynecologic evaluation of the following issues:  1. Increase in bleeding in camping for several months. She state she is passing clots size of nickle to size of quarter. She has menstrual cramps 5/10 on pain scale. State she has always had cramps. She takes Midol and this helps . She has strong family history of fibroids.   Gynecologic History No LMP recorded. Contraception: none Last Pap: 2018. Results were: normal Last mammogram: n/a   Obstetric History OB History  Gravida Para Term Preterm AB Living  2 2 2     2   SAB TAB Ectopic Multiple Live Births        0 2    # Outcome Date GA Lbr Len/2nd Weight Sex Delivery Anes PTL Lv  2 Term 04/24/18 [redacted]w[redacted]d 06:30 / 00:11 7 lb 4.4 oz (3.3 kg) F Vag-Spont EPI  LIV  1 Term 06/09/13 [redacted]w[redacted]d 12:09 / 01:22 6 lb 10 oz (3.005 kg) M Vag-Spont EPI  LIV    Past Medical History:  Diagnosis Date  . Anemia   . Asthma    exercise induced, inhaler use 8 years ago    Past Surgical History:  Procedure Laterality Date  . WISDOM TOOTH EXTRACTION      Current Outpatient Medications on File Prior to Visit  Medication Sig Dispense Refill  . Iron-Vitamin C 65-125 MG TABS Take 1 tablet by mouth daily. 90 tablet 0   No current facility-administered medications on file prior to visit.    Allergies  Allergen Reactions  . Dust Mite Extract Rash  . Tree Extract Rash    Social History   Socioeconomic History  . Marital status: Single    Spouse name: Not on file  . Number of children: Not on file  . Years of education: Not on file  . Highest education level: Not on file  Occupational History    Employer: [employer]  Tobacco Use  . Smoking status: Never Smoker  . Smokeless tobacco: Never Used  Vaping Use  . Vaping Use: Never used  Substance and Sexual Activity  . Alcohol use: No  . Drug use: No  . Sexual activity: Yes    Birth  control/protection: None    Comment: undecided  Other Topics Concern  . Not on file  Social History Narrative  . Not on file   Social Determinants of Health   Financial Resource Strain:   . Difficulty of Paying Living Expenses:   Food Insecurity:   . Worried About [redacted]w[redacted]d in the Last Year:   . Programme researcher, broadcasting/film/video in the Last Year:   Transportation Needs:   . Barista (Medical):   Freight forwarder Lack of Transportation (Non-Medical):   Physical Activity:   . Days of Exercise per Week:   . Minutes of Exercise per Session:   Stress:   . Feeling of Stress :   Social Connections:   . Frequency of Communication with Friends and Family:   . Frequency of Social Gatherings with Friends and Family:   . Attends Religious Services:   . Active Member of Clubs or Organizations:   . Attends [employer] Meetings:   Banker Marital Status:   Intimate Partner Violence:   . Fear of Current or Ex-Partner:   . Emotionally Abused:   [employer] Physically Abused:   . Sexually Abused:  Family History  Problem Relation Age of Onset  . Breast cancer Other   . Ovarian cancer Neg Hx   . Colon cancer Neg Hx   . Diabetes Neg Hx     The following portions of the patient's history were reviewed and updated as appropriate: allergies, current medications, past family history, past medical history, past social history, past surgical history and problem list.  Review of Systems Review of Systems - Negative except as mentioned in hpi Review of Systems - [name] ROS: negative for - chills, fatigue, fever, hot flashes, malaise or night sweats Hematological and Lymphatic ROS: negative for - bleeding problems or swollen lymph nodes Gastrointestinal ROS: negative for - abdominal pain, blood in stools, change in bowel habits and nausea/vomiting Musculoskeletal ROS: negative for - joint pain, muscle pain or muscular weakness Genito-Urinary ROS: negative for - change in menstrual cycle, dysmenorrhea,  dyspareunia, dysuria, genital discharge, genital ulcers, hematuria, incontinence, irregular/heavy menses, nocturia or pelvic painjj  Objective:   There were no vitals taken for this visit. CONSTITUTIONAL: Well-developed, well-nourished female in no acute distress.  HENT:  Normocephalic, atraumatic.  NECK: Normal range of motion, supple, no masses.  Normal thyroid.  SKIN: Skin is warm and dry. No rash noted. Not diaphoretic. No erythema. No pallor. NEUROLGIC: Alert and oriented to person, place, and time. PSYCHIATRIC: Normal mood and affect. Normal behavior. Normal judgment and thought content. CARDIOVASCULAR:Not Examined RESPIRATORY: Not Examined BREASTS: Not Examined ABDOMEN: Soft, non distended; Non tender.  No Organomegaly. PELVIC:  External Genitalia: Normal  BUS: Normal  Vagina: Normal  Cervix: Normal  Uterus: Normal size, shape,consistency, mobile  Adnexa: Normal  RV: Normal   Bladder: Nontender MUSCULOSKELETAL: Normal range of motion. No tenderness.  No cyanosis, clubbing, or edema.     Assessment:   Heavy Menstrual bleeding menstrual cramps    Plan:   U/s ordered.Causes of heavy menstrual bleeding  Discussed treatment options. Recommend first like birth control to decrease bleeding and cramps. Discussed fibroids with pt, discussed treatment with medications . U/s ordered. Will follow up with results. PT will let me know if she wants to use BC . Follow up prn.   Doreene Burke, CNM

## 2020-01-13 IMAGING — US US OB TRANSVAGINAL
2 series · 14 of 28 positions shown · non-contrast
Comparison: None.

CLINICAL DATA: Vaginal bleeding.  Pregnant patient.

EXAM:
OBSTETRIC <14 WK ULTRASOUND
TECHNIQUE: Transabdominal ultrasound was performed for evaluation of the
gestation as well as the maternal uterus and adnexal regions.

[Series 1: us ob transvaginal · 0.23mm/px · 13 of 124 slices shown (1 of 2)]
[im 5/124]
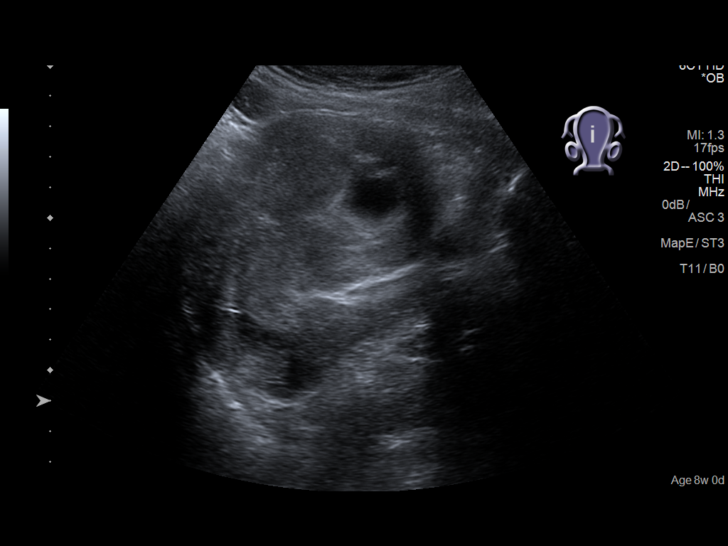
[im 15/124]
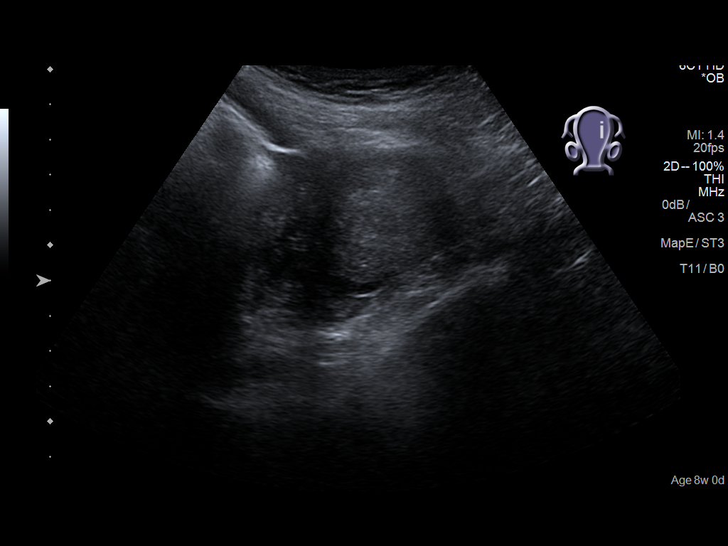
[im 24/124]
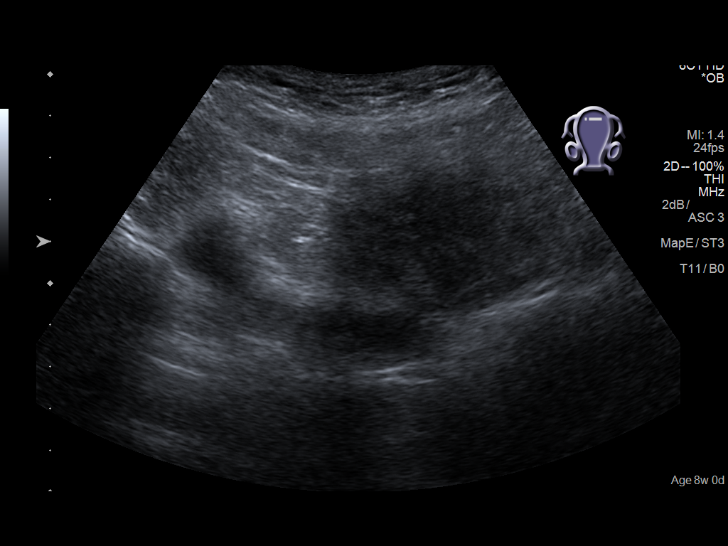
[im 34/124]
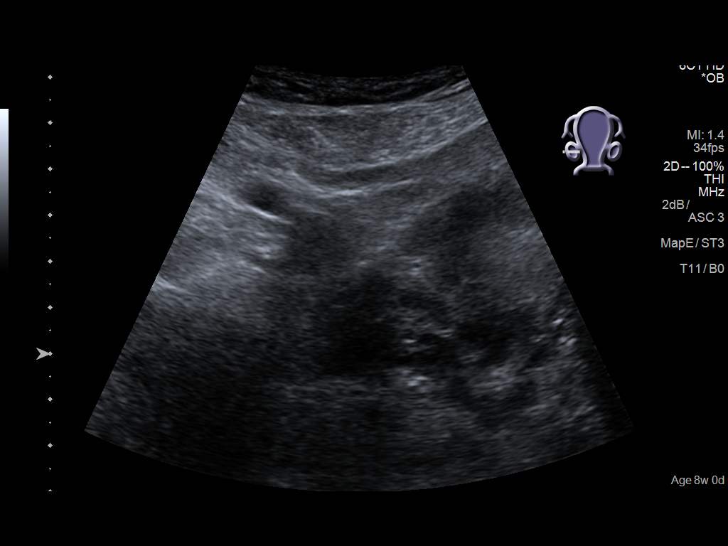
[im 43/124]
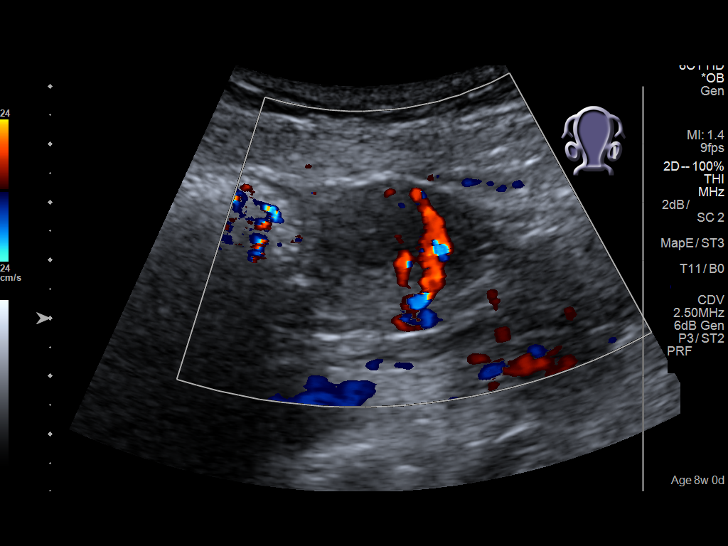
[im 53/124]
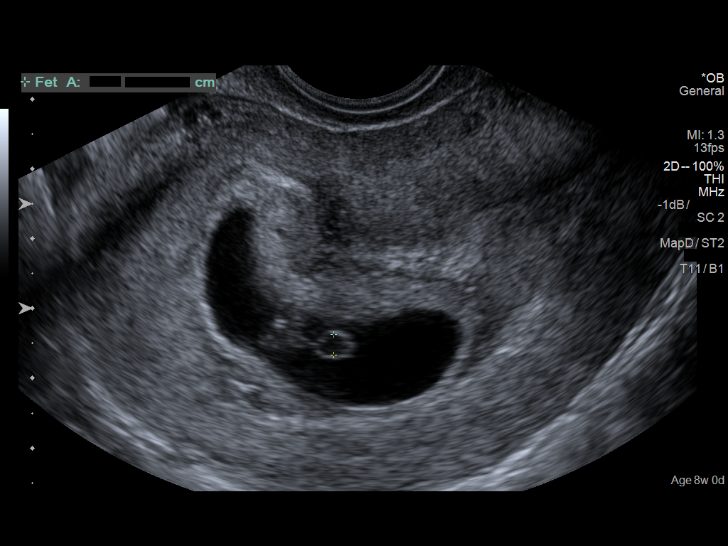
[im 62/124]
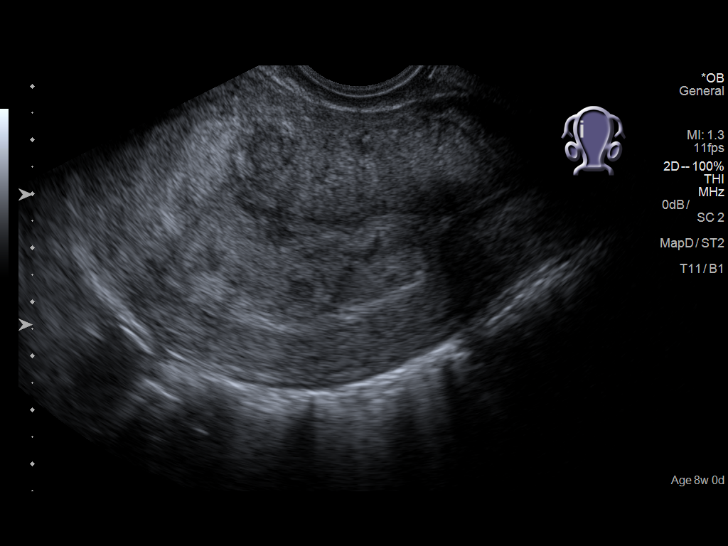
[im 71/124]
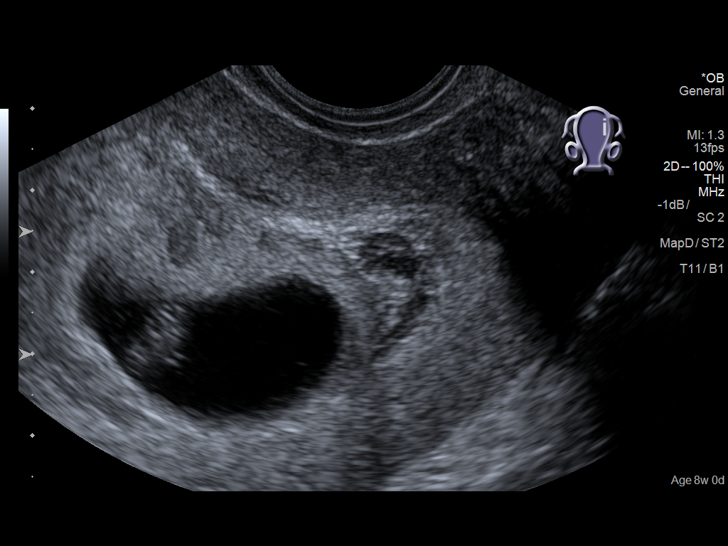
[im 81/124]
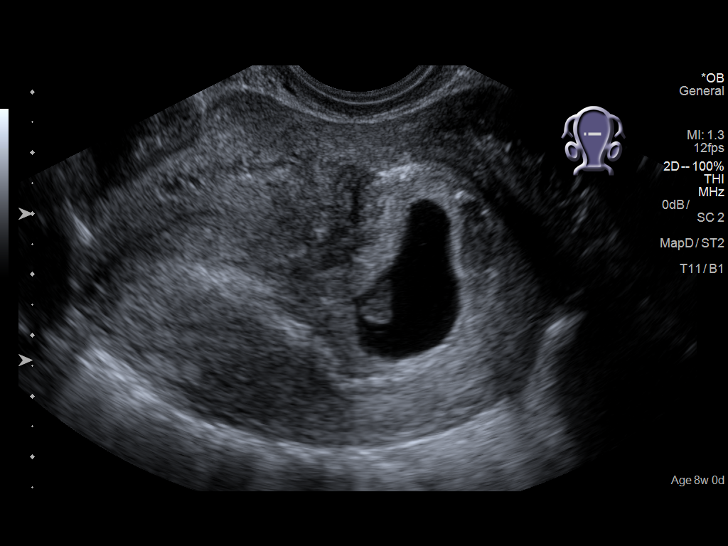
[im 90/124]
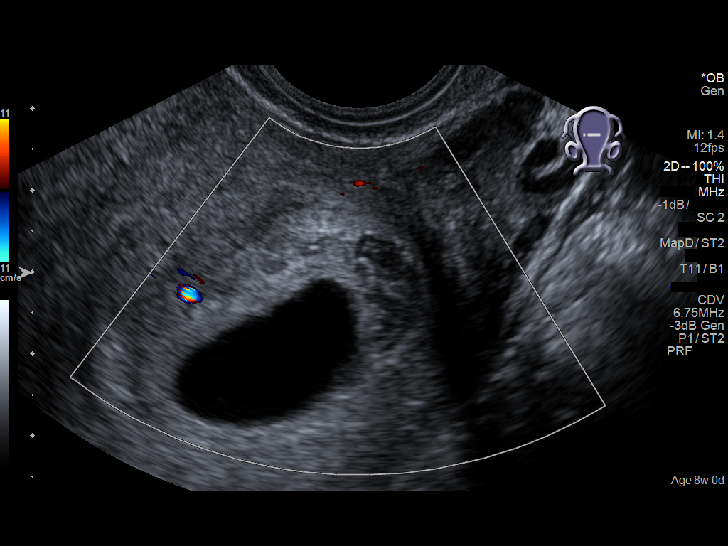
[im 100/124]
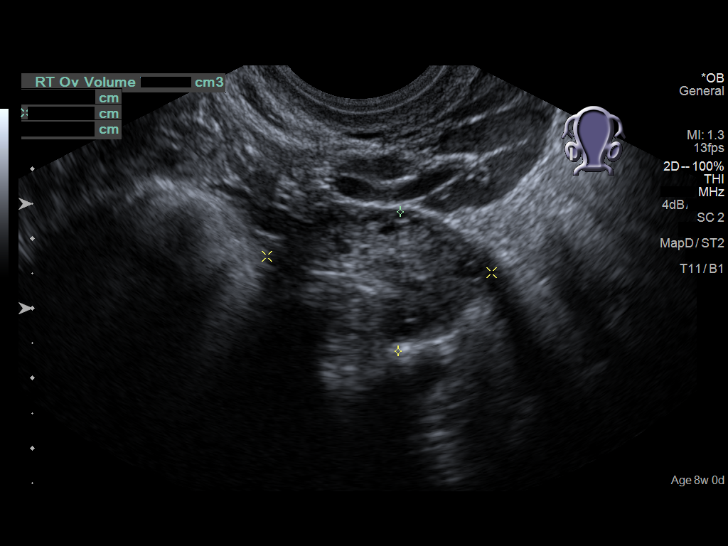
[im 109/124]
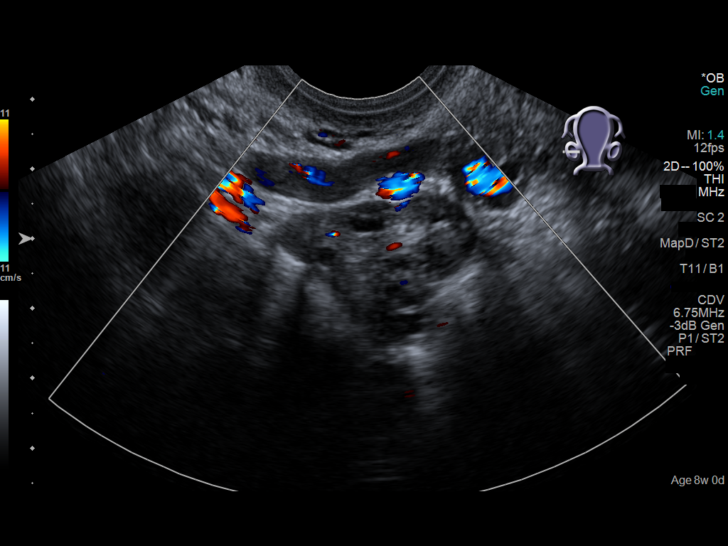
[im 119/124]
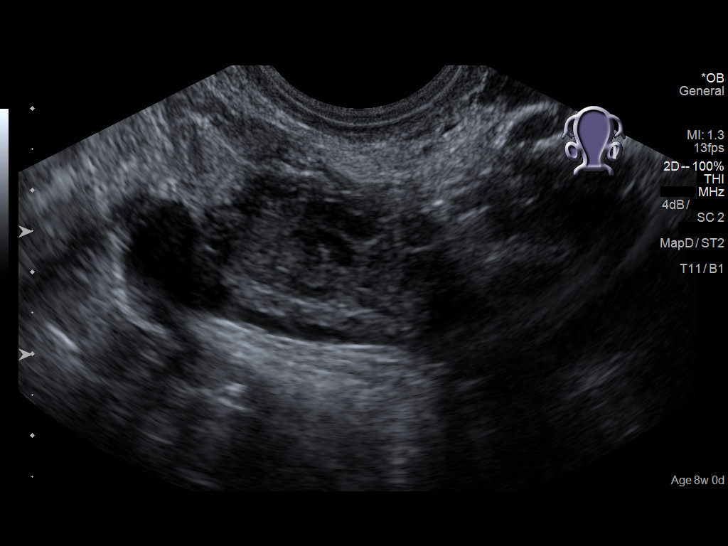

[Series 1001: us ob transvaginal · 1 of 2 slices shown (2 of 2)]
[im 1/2]
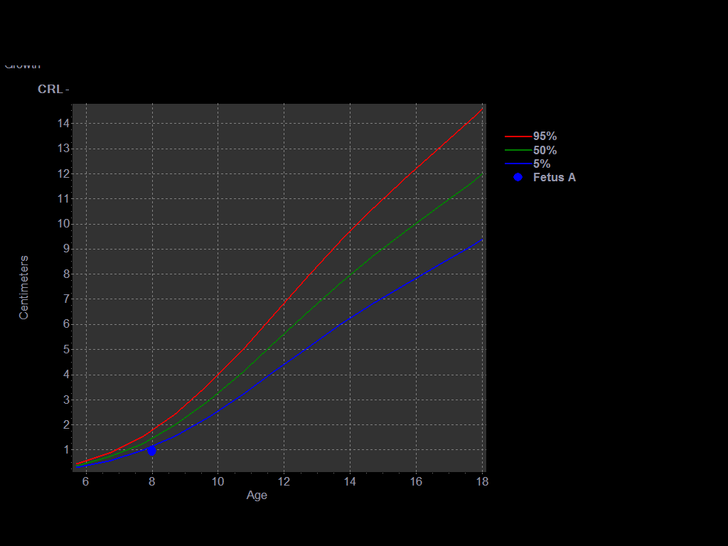

[14 of 28 positions shown; findings below may reference images not displayed]

FINDINGS: Intrauterine gestational sac: Single

Yolk sac:  Visualized.

Embryo:  Visualized.

Cardiac Activity: Visualized.

Heart Rate: 150 bpm

MSD:   mm    w     d

CRL:   9.6 mm   7 w 0 d                  US EDC: April 21, 2018

Subchorionic hemorrhage: There is a complex collection adjacent to
the IUP measuring 15 x 11 x 12 mm with mixed echogenicity.

Maternal uterus/adnexae: Normal.
IMPRESSION: 1. Single live IUP.
2. 15 x 11 x 12 mm complex collection adjacent to the IUP.
Statistically, this is most likely to represent a subchorionic
hemorrhage.

## 2020-01-15 ENCOUNTER — Ambulatory Visit (INDEPENDENT_AMBULATORY_CARE_PROVIDER_SITE_OTHER): Payer: Self-pay

## 2020-01-15 ENCOUNTER — Other Ambulatory Visit: Payer: Self-pay

## 2020-01-15 DIAGNOSIS — N920 Excessive and frequent menstruation with regular cycle: Secondary | ICD-10-CM

## 2020-01-20 ENCOUNTER — Other Ambulatory Visit: Payer: Self-pay | Admitting: Certified Nurse Midwife

## 2020-01-20 DIAGNOSIS — N83202 Unspecified ovarian cyst, left side: Secondary | ICD-10-CM

## 2020-01-20 NOTE — Progress Notes (Signed)
Left ovarian cyst , repeat u/s ordered for 6 months. Pt notified via my chart.   Doreene Burke, CNM

## 2020-05-10 ENCOUNTER — Encounter: Payer: Self-pay | Admitting: Surgical

## 2020-05-17 ENCOUNTER — Other Ambulatory Visit: Payer: Self-pay

## 2020-05-17 ENCOUNTER — Other Ambulatory Visit: Payer: Managed Care, Other (non HMO)

## 2020-05-17 DIAGNOSIS — Z20822 Contact with and (suspected) exposure to covid-19: Secondary | ICD-10-CM

## 2020-05-18 LAB — NOVEL CORONAVIRUS, NAA: SARS-CoV-2, NAA: NOT DETECTED

## 2020-05-18 LAB — SARS-COV-2, NAA 2 DAY TAT

## 2020-06-17 ENCOUNTER — Other Ambulatory Visit: Payer: Managed Care, Other (non HMO)

## 2020-06-17 DIAGNOSIS — Z20822 Contact with and (suspected) exposure to covid-19: Secondary | ICD-10-CM

## 2020-06-19 LAB — SARS-COV-2, NAA 2 DAY TAT

## 2020-06-19 LAB — NOVEL CORONAVIRUS, NAA: SARS-CoV-2, NAA: DETECTED — AB

## 2020-06-23 ENCOUNTER — Other Ambulatory Visit: Payer: Managed Care, Other (non HMO)

## 2020-12-06 ENCOUNTER — Other Ambulatory Visit: Payer: Self-pay

## 2020-12-06 ENCOUNTER — Ambulatory Visit (INDEPENDENT_AMBULATORY_CARE_PROVIDER_SITE_OTHER): Payer: Medicaid Other | Admitting: Certified Nurse Midwife

## 2020-12-06 ENCOUNTER — Encounter: Payer: Self-pay | Admitting: Certified Nurse Midwife

## 2020-12-06 ENCOUNTER — Other Ambulatory Visit (HOSPITAL_COMMUNITY)
Admission: RE | Admit: 2020-12-06 | Discharge: 2020-12-06 | Disposition: A | Discharge status: Home / Self Care | Payer: Medicaid Other | Source: Ambulatory Visit | Attending: Certified Nurse Midwife | Admitting: Certified Nurse Midwife

## 2020-12-06 VITALS — BP 112/74 | HR 80 | Ht 67.0 in | Wt 185.1 lb

## 2020-12-06 DIAGNOSIS — N898 Other specified noninflammatory disorders of vagina: Secondary | ICD-10-CM

## 2020-12-06 DIAGNOSIS — Z01419 Encounter for gynecological examination (general) (routine) without abnormal findings: Secondary | ICD-10-CM

## 2020-12-06 DIAGNOSIS — Z124 Encounter for screening for malignant neoplasm of cervix: Secondary | ICD-10-CM | POA: Insufficient documentation

## 2020-12-06 NOTE — Progress Notes (Signed)
GYNECOLOGY ANNUAL PREVENTATIVE CARE ENCOUNTER NOTE  History:     Allison [name] is a 30 y.o. G43P2002 female here for a routine annual gynecologic exam.  Current complaints: period is lighter than it used to be. States that it has become lighter and last a few days. Cramps are still the same. It comes monthly.  White milky discharge that different from her normal discharge. Request swab today. Denies abnormal vaginal bleeding, discharge, pelvic pain, problems with intercourse or other gynecologic concerns.     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 Contraception:condoms some times Last Pap: 2018. Results were: normal  Last mammogram: n/a .     The pregnancy intention screening data noted above was reviewed. Potential methods of contraception were discussed. The patient elected to proceed with Female Condom.    Obstetric History OB History  Gravida Para Term Preterm AB Living  2 2 2     2   SAB IAB Ectopic Multiple Live Births        0 2    # Outcome Date GA Lbr Len/2nd Weight Sex Delivery Anes PTL Lv  2 Term 04/24/18 [redacted]w[redacted]d 06:30 / 00:11 7 lb 4.4 oz (3.3 kg) F Vag-Spont EPI  LIV  1 Term 06/09/13 [redacted]w[redacted]d 12:09 / 01:22 6 lb 10 oz (3.005 kg) M Vag-Spont EPI  LIV    Past Medical History:  Diagnosis Date   Anemia    Asthma    exercise induced, inhaler use 8 years ago    Past Surgical History:  Procedure Laterality Date   WISDOM TOOTH EXTRACTION      No current outpatient medications on file prior to visit.   No current facility-administered medications on file prior to visit.    Allergies  Allergen Reactions   Dust Mite Extract Rash   Tree Extract Rash    Social History:  reports that she has never smoked. She has never used smokeless  tobacco. She reports that she does not drink alcohol and does not use drugs.  Family History  Problem Relation Age of Onset   Breast cancer Other    Ovarian cancer Neg Hx    Colon cancer Neg Hx    Diabetes Neg Hx     The following portions of the patient's history were reviewed and updated as appropriate: allergies, current medications, past family history, past medical history, past social history, past surgical history and problem list.  Review of Systems Pertinent items noted in HPI and remainder of comprehensive ROS otherwise negative.  Physical Exam:  BP 112/74   Pulse 80   Ht 5\' 7"  (1.702 m)   Wt 185 lb 1.6 oz (84 kg)   LMP 11/10/2020 (Approximate)   BMI 28.99 kg/m  CONSTITUTIONAL: Well-developed, well-nourished, over weight, female in no acute distress.  HENT:  Normocephalic, atraumatic, External right and left ear normal. Oropharynx is clear and moist EYES: Conjunctivae and EOM are normal. Pupils are equal, round, and reactive to light. No scleral icterus.  NECK: Normal range of motion, supple, no masses.  Normal thyroid.  SKIN: Skin is warm and dry. No rash noted. Not diaphoretic. No erythema. No pallor. MUSCULOSKELETAL: Normal range of motion. No tenderness.  No cyanosis, clubbing, or edema.  2+ distal pulses. NEUROLOGIC: Alert and oriented to person, place, and time. Normal reflexes, muscle  tone coordination.  PSYCHIATRIC: Normal mood and affect. Normal behavior. Normal judgment and thought content. CARDIOVASCULAR: Normal heart rate noted, regular rhythm RESPIRATORY: Clear to auscultation bilaterally. Effort and breath sounds normal, no problems with respiration noted. BREASTS: Symmetric in size. No masses, tenderness, skin changes, nipple drainage, or lymphadenopathy bilaterally.  ABDOMEN: Soft, no distention noted.  No tenderness, rebound or guarding.  PELVIC: Normal appearing external genitalia and urethral meatus; normal appearing vaginal mucosa and cervix.  No  abnormal discharge noted.  Pap smear obtained.  Normal uterine size, no other palpable masses, no uterine or adnexal tenderness.  .   Assessment and Plan:    1. Well woman exam with routine gynecological exam  Pap:Will follow up results of pap smear and manage accordingly. Mammogram : n/a  Labs: declines  Refills: none Referral: none Routine preventative health maintenance measures emphasized. Please refer to After Visit Summary for other counseling recommendations.      Doreene Burke, CNM [facility],  [facility]

## 2020-12-07 ENCOUNTER — Other Ambulatory Visit: Payer: Self-pay | Admitting: Certified Nurse Midwife

## 2020-12-07 LAB — CERVICOVAGINAL ANCILLARY ONLY
Bacterial Vaginitis (gardnerella): POSITIVE — AB
Candida Glabrata: NEGATIVE
Candida Vaginitis: NEGATIVE
Chlamydia: NEGATIVE
Comment: NEGATIVE
Comment: NEGATIVE
Comment: NEGATIVE
Comment: NEGATIVE
Comment: NEGATIVE
Comment: NORMAL
Neisseria Gonorrhea: NEGATIVE
Trichomonas: NEGATIVE

## 2020-12-07 MED ORDER — METRONIDAZOLE 500 MG PO TABS: 500.0000 mg | ORAL_TABLET | Freq: Two times a day (BID) | ORAL | 0 refills | Status: AC

## 2020-12-15 ENCOUNTER — Encounter: Payer: Medicaid Other | Admitting: Certified Nurse Midwife

## 2020-12-17 LAB — CYTOLOGY - PAP
Comment: NEGATIVE
Comment: NEGATIVE
Diagnosis: UNDETERMINED — AB
HPV 16: NEGATIVE
HPV 18 / 45: NEGATIVE
High risk HPV: POSITIVE — AB

## 2021-03-29 ENCOUNTER — Encounter: Payer: Self-pay | Admitting: Certified Nurse Midwife

## 2021-03-29 ENCOUNTER — Ambulatory Visit (INDEPENDENT_AMBULATORY_CARE_PROVIDER_SITE_OTHER): Payer: Medicaid Other | Admitting: Certified Nurse Midwife

## 2021-03-29 ENCOUNTER — Other Ambulatory Visit: Payer: Self-pay

## 2021-03-29 VITALS — BP 106/69 | HR 91 | Wt 188.7 lb

## 2021-03-29 DIAGNOSIS — Z3201 Encounter for pregnancy test, result positive: Secondary | ICD-10-CM | POA: Diagnosis not present

## 2021-03-29 DIAGNOSIS — N912 Amenorrhea, unspecified: Secondary | ICD-10-CM | POA: Diagnosis not present

## 2021-03-29 DIAGNOSIS — Z3200 Encounter for pregnancy test, result unknown: Secondary | ICD-10-CM

## 2021-03-29 LAB — POCT URINE PREGNANCY: Preg Test, Ur: POSITIVE — AB

## 2021-03-29 NOTE — Progress Notes (Signed)
Subjective:    Allison [name] is a 30 y.o. female who presents for evaluation of amenorrhea. She believes she could be pregnant. Pregnancy is desired. Sexual Activity: single partner, contraception: none. Current symptoms also include: fatigue. Last period was normal.   Patient's last menstrual period was 02/04/2021. The following portions of the patient's history were reviewed and updated as appropriate: allergies, current medications, past family history, past medical history, past social history, past surgical history, and problem list.  Review of Systems Pertinent items are noted in HPI.     Objective:    BP 106/69   Pulse 91   Wt 188 lb 11.2 oz (85.6 kg)   LMP 02/04/2021   BMI 29.55 kg/m  General: alert, cooperative, appears stated age, and no acute distress    Lab Review Urine HCG: positive    Assessment:    Absence of menstruation.     Plan:    Pregnancy Test:  Positive: EDC: 11/11/2021. Briefly discussed pre-natal care options. MD and midwifery care reviewed, pt plans to see midwives. Encouraged well-balanced diet, plenty of rest when needed, pre-natal vitamins daily and walking for exercise. Discussed self-help for nausea, avoiding OTC medications until consulting provider or pharmacist, other than Tylenol as needed, minimal caffeine (1-2 cups daily) and avoiding alcohol. She will schedule u/s 1 wks, nurse visit 2 wks , her initial NOB visit 5 wks. Feel free to call with any questions.   Doreene Burke, CNM

## 2021-04-07 ENCOUNTER — Encounter: Payer: Self-pay | Admitting: Oncology

## 2021-04-14 ENCOUNTER — Other Ambulatory Visit: Payer: Self-pay

## 2021-04-14 ENCOUNTER — Ambulatory Visit (INDEPENDENT_AMBULATORY_CARE_PROVIDER_SITE_OTHER): Payer: Medicaid Other

## 2021-04-14 DIAGNOSIS — Z3200 Encounter for pregnancy test, result unknown: Secondary | ICD-10-CM

## 2021-04-15 NOTE — Patient Instructions (Signed)
First Trimester of Pregnancy °The first trimester of pregnancy starts on the first day of your last menstrual period until the end of week 12. This is also called months 1 through 3 of pregnancy. °Body changes during your first trimester °Your body goes through many changes during pregnancy. The changes usually return to normal after your baby is born. °Physical changes °You may gain or lose weight. °Your breasts may grow larger and hurt. The area around your nipples may get darker. °Dark spots or blotches may develop on your face. °You may have changes in your hair. °Health changes °You may feel like you might vomit (nauseous), and you may vomit. °You may have heartburn. °You may have headaches. °You may have trouble pooping (constipation). °Your gums may bleed. °Other changes °You may get tired easily. °You may pee (urinate) more often. °Your menstrual periods will stop. °You may not feel hungry. °You may want to eat certain kinds of food. °You may have changes in your emotions from day to day. °You may have more dreams. °Follow these instructions at home: °Medicines °Take over-the-counter and prescription medicines only as told by your doctor. Some medicines are not safe during pregnancy. °Take a prenatal vitamin that contains at least 600 micrograms (mcg) of folic acid. °Eating and drinking °Eat healthy meals that include: °Fresh fruits and vegetables. °Whole grains. °Good sources of protein, such as meat, eggs, or tofu. °Low-fat dairy products. °Avoid raw meat and unpasteurized juice, milk, and cheese. °If you feel like you may vomit, or you vomit: °Eat 4 or 5 small meals a day instead of 3 large meals. °Try eating a few soda crackers. °Drink liquids between meals instead of during meals. °You may need to take these actions to prevent or treat trouble pooping: °Drink enough fluids to keep your pee (urine) pale yellow. °Eat foods that are high in fiber. These include beans, whole grains, and fresh fruits and  vegetables. °Limit foods that are high in fat and sugar. These include fried or sweet foods. °Activity °Exercise only as told by your doctor. Most people can do their usual exercise routine during pregnancy. °Stop exercising if you have cramps or pain in your lower belly (abdomen) or low back. °Do not exercise if it is too hot or too humid, or if you are in a place of great height (high altitude). °Avoid heavy lifting. °If you choose to, you may have sex unless your doctor tells you not to. °Relieving pain and discomfort °Wear a good support bra if your breasts are sore. °Rest with your legs raised (elevated) if you have leg cramps or low back pain. °If you have bulging veins (varicose veins) in your legs: °Wear support hose as told by your doctor. °Raise your feet for 15 minutes, 3-4 times a day. °Limit salt in your food. °Safety °Wear your seat belt at all times when you are in a car. °Talk with your doctor if someone is hurting you or yelling at you. °Talk with your doctor if you are feeling sad or have thoughts of hurting yourself. °Lifestyle °Do not use hot tubs, steam rooms, or saunas. °Do not douche. Do not use tampons or scented sanitary pads. °Do not use herbal medicines, illegal drugs, or medicines that are not approved by your doctor. Do not drink alcohol. °Do not smoke or use any products that contain nicotine or tobacco. If you need help quitting, ask your doctor. °Avoid cat litter boxes and soil that is used by cats. These carry germs   that can cause harm to the baby and can cause a loss of your baby by miscarriage or stillbirth. °General instructions °Keep all follow-up visits. This is important. °Ask for help if you need counseling or if you need help with nutrition. Your doctor can give you advice or tell you where to go for help. °Visit your dentist. At home, brush your teeth with a soft toothbrush. Floss gently. °Write down your questions. Take them to your prenatal visits. °Where to find more  information °American Pregnancy Association: americanpregnancy.org °American College of Obstetricians and Gynecologists: www.acog.org °Office on Women's Health: womenshealth.gov/pregnancy °Contact a doctor if: °You are dizzy. °You have a fever. °You have mild cramps or pressure in your lower belly. °You have a nagging pain in your belly area. °You continue to feel like you may vomit, you vomit, or you have watery poop (diarrhea) for 24 hours or longer. °You have a bad-smelling fluid coming from your vagina. °You have pain when you pee. °You are exposed to a disease that spreads from person to person, such as chickenpox, measles, Zika virus, HIV, or hepatitis. °Get help right away if: °You have spotting or bleeding from your vagina. °You have very bad belly cramping or pain. °You have shortness of breath or chest pain. °You have any kind of injury, such as from a fall or a car crash. °You have new or increased pain, swelling, or redness in an arm or leg. °Summary °The first trimester of pregnancy starts on the first day of your last menstrual period until the end of week 12 (months 1 through 3). °Eat 4 or 5 small meals a day instead of 3 large meals. °Do not smoke or use any products that contain nicotine or tobacco. If you need help quitting, ask your doctor. °Keep all follow-up visits. °This information is not intended to replace advice given to you by your health care provider. Make sure you discuss any questions you have with your health care provider. °Document Revised: 10/22/2019 Document Reviewed: 08/28/2019 °Elsevier Patient Education © 2022 Elsevier Inc. °Commonly Asked Questions During Pregnancy ° °Cats: A parasite can be excreted in cat feces.  To avoid exposure you need to have another person empty the little box.  If you must empty the litter box you will need to wear gloves.  Wash your hands after handling your cat.  This parasite can also be found in raw or undercooked meat so this should also be  avoided. ° °Colds, Sore Throats, Flu: Please check your medication sheet to see what you can take for symptoms.  If your symptoms are unrelieved by these medications please call the office. ° °Dental Work: Most any dental work your dentist recommends is permitted.  X-rays should only be taken during the first trimester if absolutely necessary.  Your abdomen should be shielded with a lead apron during all x-rays.  Please notify your provider prior to receiving any x-rays.  Novocaine is fine; gas is not recommended.  If your dentist requires a note from us prior to dental work please call the office and we will provide one for you. ° °Exercise: Exercise is an important part of staying healthy during your pregnancy.  You may continue most exercises you were accustomed to prior to pregnancy.  Later in your pregnancy you will most likely notice you have difficulty with activities requiring balance like riding a bicycle.  It is important that you listen to your body and avoid activities that put you at a higher risk   of falling.  Adequate rest and staying well hydrated are a must!  If you have questions about the safety of specific activities ask your provider.   ° °Exposure to Children with illness: Try to avoid obvious exposure; report any symptoms to us when noted,  If you have chicken pos, red measles or mumps, you should be immune to these diseases.   Please do not take any vaccines while pregnant unless you have checked with your OB provider. ° °Fetal Movement: After 28 weeks we recommend you do "kick counts" twice daily.  Lie or sit down in a calm quiet environment and count your baby movements "kicks".  You should feel your baby at least 10 times per hour.  If you have not felt 10 kicks within the first hour get up, walk around and have something sweet to eat or drink then repeat for an additional hour.  If count remains less than 10 per hour notify your provider. ° °Fumigating: Follow your pest control agent's  advice as to how long to stay out of your home.  Ventilate the area well before re-entering. ° °Hemorrhoids:   Most over-the-counter preparations can be used during pregnancy.  Check your medication to see what is safe to use.  It is important to use a stool softener or fiber in your diet and to drink lots of liquids.  If hemorrhoids seem to be getting worse please call the office.  ° °Hot Tubs:  Hot tubs Jacuzzis and saunas are not recommended while pregnant.  These increase your internal body temperature and should be avoided. ° °Intercourse:  Sexual intercourse is safe during pregnancy as long as you are comfortable, unless otherwise advised by your provider.  Spotting may occur after intercourse; report any bright red bleeding that is heavier than spotting. ° °Labor:  If you know that you are in labor, please go to the hospital.  If you are unsure, please call the office and let us help you decide what to do. ° °Lifting, straining, etc:  If your job requires heavy lifting or straining please check with your provider for any limitations.  Generally, you should not lift items heavier than that you can lift simply with your hands and arms (no back muscles) ° °Painting:  Paint fumes do not harm your pregnancy, but may make you ill and should be avoided if possible.  Latex or water based paints have less odor than oils.  Use adequate ventilation while painting. ° °Permanents & Hair Color:  Chemicals in hair dyes are not recommended as they cause increase hair dryness which can increase hair loss during pregnancy.  " Highlighting" and permanents are allowed.  Dye may be absorbed differently and permanents may not hold as well during pregnancy. ° °Sunbathing:  Use a sunscreen, as skin burns easily during pregnancy.  Drink plenty of fluids; avoid over heating. ° °Tanning Beds:  Because their possible side effects are still unknown, tanning beds are not recommended. ° °Ultrasound Scans:  Routine ultrasounds are performed  at approximately 20 weeks.  You will be able to see your baby's general anatomy an if you would like to know the gender this can usually be determined as well.  If it is questionable when you conceived you may also receive an ultrasound early in your pregnancy for dating purposes.  Otherwise ultrasound exams are not routinely performed unless there is a medical necessity.  Although you can request a scan we ask that you pay for it when conducted   because insurance does not cover " patient request" scans. ° °Work: If your pregnancy proceeds without complications you may work until your due date, unless your physician or employer advises otherwise. ° °Round Ligament Pain/Pelvic Discomfort:  Sharp, shooting pains not associated with bleeding are fairly common, usually occurring in the second trimester of pregnancy.  They tend to be worse when standing up or when you remain standing for long periods of time.  These are the result of pressure of certain pelvic ligaments called "round ligaments".  Rest, Tylenol and heat seem to be the most effective relief.  As the womb and fetus grow, they rise out of the pelvis and the discomfort improves.  Please notify the office if your pain seems different than that described.  It may represent a more serious condition. ° °Morning Sickness °Morning sickness is when you feel like you may vomit (feel nauseous) during pregnancy. Sometimes, you may vomit. Morning sickness most often happens in the morning, but it can also happen at any time of the day. Some women may have morning sickness that makes them vomit all the time. This is a more serious problem that needs treatment. °What are the causes? °The cause of this condition is not known. °What increases the risk? °You had vomiting or a feeling like you may vomit before your pregnancy. °You had morning sickness in another pregnancy. °You are pregnant with more than one baby, such as twins. °What are the signs or symptoms? °Feeling like  you may vomit. °Vomiting. °How is this treated? °Treatment is usually not needed for this condition. You may only need to change what you eat. In some cases, your doctor may give you some things to take for your condition. These include: °Vitamin B6 supplements. °Medicines to treat the feeling that you may vomit. °Ginger. °Follow these instructions at home: °Medicines °Take over-the-counter and prescription medicines only as told by your doctor. Do not take any medicines until you talk with your doctor about them first. °Take multivitamins before you get pregnant. These can stop or lessen the symptoms of morning sickness. °Eating and drinking °Eat dry toast or crackers before getting out of bed. °Eat 5 or 6 small meals a day. °Eat dry and bland foods like rice and baked potatoes. °Do not eat greasy, fatty, or spicy foods. °Have someone cook for you if the smell of food causes you to vomit or to feel like you may vomit. °If you feel like you may vomit after taking prenatal vitamins, take them at night or with a snack. °Eat protein foods when you need a snack. Nuts, yogurt, and cheese are good choices. °Drink fluids throughout the day. °Try ginger ale made with real ginger, ginger tea made from fresh grated ginger, or ginger candies. °General instructions °Do not smoke or use any products that contain nicotine or tobacco. If you need help quitting, ask your doctor. °Use an air purifier to keep the air in your house free of smells. °Get lots of fresh air. °Try to avoid smells that make you feel sick. °Try wearing an acupressure wristband. This is a wristband that is used to treat seasickness. °Try a treatment called acupuncture. In this treatment, a doctor puts needles into certain areas of your body to make you feel better. °Contact a doctor if: °You need medicine to feel better. °You feel dizzy or light-headed. °You are losing weight. °Get help right away if: °The feeling that you may vomit will not go away, or you  cannot   stop vomiting. °You faint. °You have very bad pain in your belly. °Summary °Morning sickness is when you feel like you may vomit (feel nauseous) during pregnancy. °You may feel sick in the morning, but you can feel this way at any time of the day. °Making some changes to what you eat may help your symptoms go away. °This information is not intended to replace advice given to you by your health care provider. Make sure you discuss any questions you have with your health care provider. °Document Revised: 12/29/2019 Document Reviewed: 12/08/2019 °Elsevier Patient Education © 2022 Elsevier Inc. ° °

## 2021-04-18 ENCOUNTER — Ambulatory Visit (INDEPENDENT_AMBULATORY_CARE_PROVIDER_SITE_OTHER): Payer: Medicaid Other | Admitting: Certified Nurse Midwife

## 2021-04-18 ENCOUNTER — Other Ambulatory Visit: Payer: Self-pay

## 2021-04-18 VITALS — BP 97/68 | HR 98 | Resp 16 | Ht 67.0 in | Wt 186.5 lb

## 2021-04-18 DIAGNOSIS — Z3A10 10 weeks gestation of pregnancy: Secondary | ICD-10-CM

## 2021-04-18 DIAGNOSIS — Z3481 Encounter for supervision of other normal pregnancy, first trimester: Secondary | ICD-10-CM

## 2021-04-18 DIAGNOSIS — Z113 Encounter for screening for infections with a predominantly sexual mode of transmission: Secondary | ICD-10-CM | POA: Diagnosis not present

## 2021-04-18 NOTE — Progress Notes (Signed)
Allison [name] presents for NOB nurse interview visit. Pregnancy confirmation done 11/01/202 .  [id number]. Pregnancy education material explained and given. No cats in the home. NOB labs ordered. Sickle cell ordered.  HIV labs and Drug screen were explained optional and she did not decline. Drug screen ordered. PNV encouraged. Genetic screening options discussed. Genetic testing: Ordered/Declined/Unsure.  Pt may discuss with provider.  Financial policy not applicable FMLA form reviewed and signed. Pt. To follow up with [name] in 2 weeks for NOB physical.  All questions answered.

## 2021-04-19 LAB — URINALYSIS, ROUTINE W REFLEX MICROSCOPIC

## 2021-04-20 LAB — VIRAL HEPATITIS HBV, HCV
HCV Ab: <0.1 s/co ratio (ref 0.0–0.9)
Hep B Core Total Ab: NEGATIVE
Hep B Surface Ab, Qual: NONREACTIVE
Hepatitis B Surface Ag: NEGATIVE

## 2021-04-20 LAB — URINE CULTURE, OB REFLEX

## 2021-04-20 LAB — HGB SOLU + RFLX FRAC: Sickle Solubility Test - HGBRFX: NEGATIVE

## 2021-04-20 LAB — CULTURE, OB URINE

## 2021-04-20 LAB — GC/CHLAMYDIA PROBE AMP
Chlamydia trachomatis, NAA: NEGATIVE
Neisseria Gonorrhoeae by PCR: NEGATIVE

## 2021-04-20 LAB — VARICELLA ZOSTER ANTIBODY, IGG: Varicella zoster IgG: 403 index (ref >165)

## 2021-04-20 LAB — PARVOVIRUS B19 ANTIBODY, IGG AND IGM
Parvovirus B19 IgG: 0.7 index (ref 0.0–0.8)
Parvovirus B19 IgM: 0.1 index (ref 0.0–0.8)

## 2021-04-20 LAB — ABO AND RH: Rh Factor: POSITIVE

## 2021-04-20 LAB — RUBELLA SCREEN: Rubella Antibodies, IGG: <0.9 index — ABNORMAL LOW (ref >0.99)

## 2021-04-20 LAB — RPR: RPR Ser Ql: NONREACTIVE

## 2021-04-20 LAB — HCV INTERPRETATION

## 2021-04-20 LAB — HIV ANTIBODY (ROUTINE TESTING W REFLEX): HIV Screen 4th Generation wRfx: NONREACTIVE

## 2021-04-20 LAB — ANTIBODY SCREEN: Antibody Screen: NEGATIVE

## 2021-04-27 LAB — NICOTINE SCREEN, URINE

## 2021-04-27 LAB — PAIN MGT SCRN (14 DRUGS), UR

## 2021-05-03 ENCOUNTER — Encounter: Payer: Medicaid Other | Admitting: Certified Nurse Midwife

## 2021-05-16 ENCOUNTER — Telehealth: Payer: Self-pay | Admitting: Certified Nurse Midwife

## 2021-05-16 NOTE — Telephone Encounter (Signed)
Pt is calling in stating that she terminated her pregnancy and is in need of birth control.  At this present time she is not able to afford the medication and would like to see if the provider has samples until she is able to get her insurance straighten out.  Pt would like to have a call back.

## 2021-05-18 NOTE — Telephone Encounter (Signed)
Pt made aware samples left w/ front office for her to pick up.

## 2021-05-25 ENCOUNTER — Encounter: Payer: Self-pay | Admitting: Oncology

## 2023-01-02 ENCOUNTER — Encounter: Payer: Self-pay | Admitting: Oncology

## 2023-01-11 ENCOUNTER — Encounter: Payer: Self-pay | Admitting: Oncology

## 2023-01-11 ENCOUNTER — Encounter: Payer: Self-pay | Admitting: Internal Medicine

## 2023-01-11 ENCOUNTER — Ambulatory Visit (INDEPENDENT_AMBULATORY_CARE_PROVIDER_SITE_OTHER): Payer: Managed Care, Other (non HMO) | Admitting: Internal Medicine

## 2023-01-11 VITALS — BP 120/64 | HR 96 | Ht 67.0 in | Wt 185.2 lb

## 2023-01-11 DIAGNOSIS — J4520 Mild intermittent asthma, uncomplicated: Secondary | ICD-10-CM | POA: Diagnosis not present

## 2023-01-11 DIAGNOSIS — R42 Dizziness and giddiness: Secondary | ICD-10-CM | POA: Diagnosis not present

## 2023-01-11 DIAGNOSIS — R5383 Other fatigue: Secondary | ICD-10-CM | POA: Diagnosis not present

## 2023-01-11 DIAGNOSIS — R87610 Atypical squamous cells of undetermined significance on cytologic smear of cervix (ASC-US): Secondary | ICD-10-CM | POA: Diagnosis not present

## 2023-01-11 DIAGNOSIS — D508 Other iron deficiency anemias: Secondary | ICD-10-CM | POA: Diagnosis not present

## 2023-01-11 DIAGNOSIS — Z1389 Encounter for screening for other disorder: Secondary | ICD-10-CM | POA: Diagnosis not present

## 2023-01-11 DIAGNOSIS — F411 Generalized anxiety disorder: Secondary | ICD-10-CM

## 2023-01-11 DIAGNOSIS — J301 Allergic rhinitis due to pollen: Secondary | ICD-10-CM | POA: Diagnosis not present

## 2023-01-11 LAB — POCT URINALYSIS DIPSTICK
Bilirubin, UA: NEGATIVE
Blood, UA: NEGATIVE
Glucose, UA: NEGATIVE
Ketones, UA: NEGATIVE
Leukocytes, UA: NEGATIVE
Nitrite, UA: NEGATIVE
Protein, UA: NEGATIVE
Spec Grav, UA: 1.025 (ref 1.010–1.025)
Urobilinogen, UA: 1 E.U./dL
pH, UA: 7 (ref 5.0–8.0)

## 2023-01-11 NOTE — Progress Notes (Signed)
New Patient Office Visit  Subjective    Patient ID: Allison [name], female    DOB: [birth year]  Age: 32 y.o. MRN: [id number]  CC:  Chief Complaint  Patient presents with   Establish Care    NPE    HPI [name] presents to establish care Previous Primary Care provider/office:   she does have additional concerns to discuss today.   Patient comes into establish PMD she has been feeling fine until recently when she felt an anxiety attack.  Patient reports that she was driving at night and felt some blurring of her vision which she thinks she needs a new eye exam and new prescription for her glasses, it made her anxious and caused some palpitations. Also reports of heavy menstrual bleeding, she was evaluated previously for uterine fibroids but was negative.  However she needs to follow-up with her OB /GYN for ASCUS and HPV positive Pap smear.  Referral sent. Mentions fatigue and occasional dizziness. Her left thyroid lobe is more prominent, will check thyroid profile.    Outpatient Encounter Medications as of 01/11/2023  Medication Sig   Prenatal Vit-Fe Fumarate-FA (PRENATAL PO) Take by mouth. (Patient not taking: Reported on 01/11/2023)   No facility-administered encounter medications on file as of 01/11/2023.    Past Medical History:  Diagnosis Date   Anemia    Asthma    exercise induced, inhaler use 8 years ago    Past Surgical History:  Procedure Laterality Date   WISDOM TOOTH EXTRACTION      Family History  Problem Relation Age of Onset   Asthma Mother    Breast cancer Other    Ovarian cancer Neg Hx    Colon cancer Neg Hx    Diabetes Neg Hx     Social History   Socioeconomic History   Marital status: Single    Spouse name: Not on file   Number of children: Not on file   Years of education: Not on file   Highest education level: Not on file  Occupational History    Employer: [employer]  Tobacco Use   Smoking status: Never    Passive  exposure: Past   Smokeless tobacco: Never  Vaping Use   Vaping status: Never Used  Substance and Sexual Activity   Alcohol use: No   Drug use: No   Sexual activity: Yes    Birth control/protection: None    Comment: undecided  Other Topics Concern   Not on file  Social History Narrative   Not on file   Social Determinants of Health   Financial Resource Strain: Low Risk  (04/24/2018)   Overall Financial Resource Strain (CARDIA)    Difficulty of Paying Living Expenses: Not hard at all  Food Insecurity: No Food Insecurity (04/24/2018)   Hunger Vital Sign    Worried About Running Out of Food in the Last Year: Never true    Ran Out of Food in the Last Year: Never true  Transportation Needs: No Transportation Needs (04/24/2018)   PRAPARE - Administrator, Civil Service (Medical): No    Lack of Transportation (Non-Medical): No  Physical Activity: Unknown (04/24/2018)   Exercise Vital Sign    Days of Exercise per Week: 3 days    Minutes of Exercise per Session: Not on file  Stress: No Stress Concern Present (04/24/2018)   [employer] of Occupational Health - Occupational Stress Questionnaire    Feeling of Stress : Not at all  Social  Connections: Moderately Integrated (04/24/2018)   Social Connection and Isolation Panel [NHANES]    Frequency of Communication with Friends and Family: Three times a week    Frequency of Social Gatherings with Friends and Family: Three times a week    Attends Religious Services: 1 to 4 times per year    Active Member of Clubs or Organizations: No    Attends Banker Meetings: Never    Marital Status: Living with partner  Intimate Partner Violence: Unknown (04/24/2018)   Humiliation, Afraid, Rape, and Kick questionnaire    Fear of Current or Ex-Partner: Not asked    Emotionally Abused: Not on file    Physically Abused: Not on file    Sexually Abused: Not on file    Review of Systems  Constitutional:  Positive for  malaise/fatigue. Negative for chills, fever and weight loss.  HENT: Negative.  Negative for congestion, sinus pain and sore throat.   Eyes: Negative.  Negative for double vision, photophobia, pain, discharge and redness.  Respiratory: Negative.  Negative for cough, shortness of breath and stridor.   Cardiovascular: Negative.  Negative for chest pain, palpitations and leg swelling.  Gastrointestinal: Negative.  Negative for abdominal pain, blood in stool, constipation, diarrhea, heartburn, melena, nausea and vomiting.  Genitourinary: Negative.  Negative for dysuria and flank pain.  Musculoskeletal:  Negative for back pain, joint pain and myalgias.  Skin: Negative.   Neurological:  Positive for dizziness. Negative for tingling, tremors, sensory change and headaches.  Endo/Heme/Allergies: Negative.   Psychiatric/Behavioral:  Negative for depression and suicidal ideas. The patient is nervous/anxious.         Objective    BP 120/64   Pulse 96   Ht 5\' 7"  (1.702 m)   Wt 185 lb 3.2 oz (84 kg)   SpO2 98%   BMI 29.01 kg/m   Physical Exam Vitals and nursing note reviewed.  Constitutional:      Appearance: Normal appearance.  HENT:     Head: Normocephalic and atraumatic.     Nose: Nose normal.     Mouth/Throat:     Mouth: Mucous membranes are moist.     Pharynx: Oropharynx is clear.  Eyes:     Conjunctiva/sclera: Conjunctivae normal.     Pupils: Pupils are equal, round, and reactive to light.  Cardiovascular:     Rate and Rhythm: Normal rate and regular rhythm.     Pulses: Normal pulses.     Heart sounds: Normal heart sounds. No murmur heard. Pulmonary:     Effort: Pulmonary effort is normal.     Breath sounds: Normal breath sounds. No wheezing.  Abdominal:     General: Bowel sounds are normal.     Palpations: Abdomen is soft.     Tenderness: There is no abdominal tenderness. There is no right CVA tenderness or left CVA tenderness.  Musculoskeletal:        General: Normal  range of motion.     Cervical back: Normal range of motion.     Right lower leg: No edema.     Left lower leg: No edema.  Skin:    General: Skin is warm and dry.  Neurological:     General: No focal deficit present.     Mental Status: She is alert and oriented to person, place, and time.  Psychiatric:        Mood and Affect: Mood normal.        Behavior: Behavior normal.  Assessment & Plan:  Patient will get an eye exam. Labs today. GYN referral for abnormal Pap. Problem List Items Addressed This Visit     Iron deficiency anemia - Primary   Relevant Orders   CBC With Differential   Dizziness   Relevant Orders   CMP14+EGFR   Lipid Panel w/o Chol/HDL Ratio   GAD (generalized anxiety disorder)   Seasonal allergic rhinitis due to pollen   Mild intermittent asthma without complication   Other Visit Diagnoses     Other fatigue       Relevant Orders   TSH+T4F+T3Free   Atypical squamous cells of undetermined significance on cytologic smear of cervix (ASC-US)       Relevant Orders   Ambulatory referral to Obstetrics / Gynecology   Screening for blood or protein in urine       Relevant Orders   POCT Urinalysis Dipstick (56213) (Completed)       Return in about 2 weeks (around 01/25/2023).   Total time spent: 30 minutes  Margaretann Loveless, MD  01/11/2023   This document may have been prepared by [facility] Voice Recognition software and as such may include unintentional dictation errors.

## 2023-01-12 ENCOUNTER — Encounter: Payer: Self-pay | Admitting: Oncology

## 2023-01-12 LAB — CBC WITH DIFFERENTIAL/PLATELET
Basophils Absolute: 0 10*3/uL (ref 0.0–0.2)
Basos: 1 %
EOS (ABSOLUTE): 0.1 10*3/uL (ref 0.0–0.4)
Eos: 2 %
Hematocrit: 36.7 % (ref 34.0–46.6)
Hemoglobin: 11 g/dL — ABNORMAL LOW (ref 11.1–15.9)
Immature Grans (Abs): 0 10*3/uL (ref 0.0–0.1)
Immature Granulocytes: 0 %
Lymphocytes Absolute: 2.2 10*3/uL (ref 0.7–3.1)
Lymphs: 51 %
MCH: 22.4 pg — ABNORMAL LOW (ref 26.6–33.0)
MCHC: 30 g/dL — ABNORMAL LOW (ref 31.5–35.7)
MCV: 75 fL — ABNORMAL LOW (ref 79–97)
Monocytes Absolute: 0.5 10*3/uL (ref 0.1–0.9)
Monocytes: 11 %
Neutrophils Absolute: 1.5 10*3/uL (ref 1.4–7.0)
Neutrophils: 35 %
Platelets: 235 10*3/uL (ref 150–450)
RBC: 4.91 x10E6/uL (ref 3.77–5.28)
RDW: 13.6 % (ref 11.7–15.4)
WBC: 4.3 10*3/uL (ref 3.4–10.8)

## 2023-01-12 LAB — CMP14+EGFR
ALT: 9 IU/L (ref 0–32)
AST: 23 IU/L (ref 0–40)
Albumin: 4.3 g/dL (ref 3.9–4.9)
Alkaline Phosphatase: 61 IU/L (ref 44–121)
BUN/Creatinine Ratio: 10 (ref 9–23)
BUN: 8 mg/dL (ref 6–20)
Bilirubin Total: 0.4 mg/dL (ref 0.0–1.2)
CO2: 22 mmol/L (ref 20–29)
Calcium: 9.7 mg/dL (ref 8.7–10.2)
Chloride: 102 mmol/L (ref 96–106)
Creatinine, Ser: 0.82 mg/dL (ref 0.57–1.00)
Globulin, Total: 2.8 g/dL (ref 1.5–4.5)
Glucose: 83 mg/dL (ref 70–99)
Potassium: 4.4 mmol/L (ref 3.5–5.2)
Sodium: 138 mmol/L (ref 134–144)
Total Protein: 7.1 g/dL (ref 6.0–8.5)
eGFR: 96 mL/min/{1.73_m2} (ref >59)

## 2023-01-12 LAB — LIPID PANEL W/O CHOL/HDL RATIO
Cholesterol, Total: 175 mg/dL (ref 100–199)
HDL: 61 mg/dL (ref >39)
LDL Chol Calc (NIH): 101 mg/dL — ABNORMAL HIGH (ref 0–99)
Triglycerides: 66 mg/dL (ref 0–149)
VLDL Cholesterol Cal: 13 mg/dL (ref 5–40)

## 2023-01-12 LAB — TSH+T4F+T3FREE
Free T4: 1.21 ng/dL (ref 0.82–1.77)
T3, Free: 2.7 pg/mL (ref 2.0–4.4)
TSH: 1.43 u[IU]/mL (ref 0.450–4.500)

## 2023-01-25 ENCOUNTER — Ambulatory Visit: Payer: Managed Care, Other (non HMO) | Admitting: Internal Medicine

## 2023-01-25 ENCOUNTER — Encounter: Payer: Self-pay | Admitting: Internal Medicine

## 2023-01-25 VITALS — BP 106/78 | HR 86 | Ht 67.0 in | Wt 187.6 lb

## 2023-01-25 DIAGNOSIS — J301 Allergic rhinitis due to pollen: Secondary | ICD-10-CM | POA: Diagnosis not present

## 2023-01-25 DIAGNOSIS — E049 Nontoxic goiter, unspecified: Secondary | ICD-10-CM

## 2023-01-25 DIAGNOSIS — D508 Other iron deficiency anemias: Secondary | ICD-10-CM | POA: Diagnosis not present

## 2023-01-25 DIAGNOSIS — R5383 Other fatigue: Secondary | ICD-10-CM | POA: Diagnosis not present

## 2023-01-25 MED ORDER — FUSION PLUS PO CAPS: 1.0000 | ORAL_CAPSULE | Freq: Every day | ORAL | 3 refills | Status: DC

## 2023-01-25 NOTE — Progress Notes (Signed)
Established Patient Office Visit  Subjective:  Patient ID: Allison [name], female    DOB: 04-10-1991  Age: 32 y.o. MRN: [id number]  Chief Complaint  Patient presents with   Follow-up    2 week follow up    Patient comes in for her 2-week follow-up.  She had labs done at her last visit which we discussed today her hemoglobin is low as well as indices.  Patient has history of iron deficiency anemia and currently she is not taking her supplement.  Will send in a prescription for iron supplement. Her thyroid profile is normal with patient has fullness of the neck, will schedule a thyroid ultrasound. She has also made appointment for eye exam and OB/GYN exam.    No other concerns at this time.   Past Medical History:  Diagnosis Date   Anemia    Asthma    exercise induced, inhaler use 8 years ago    Past Surgical History:  Procedure Laterality Date   WISDOM TOOTH EXTRACTION      Social History   Socioeconomic History   Marital status: Single    Spouse name: Not on file   Number of children: Not on file   Years of education: Not on file   Highest education level: Not on file  Occupational History    Employer: [employer]  Tobacco Use   Smoking status: Never    Passive exposure: Past   Smokeless tobacco: Never  Vaping Use   Vaping status: Never Used  Substance and Sexual Activity   Alcohol use: No   Drug use: No   Sexual activity: Yes    Birth control/protection: None    Comment: undecided  Other Topics Concern   Not on file  Social History Narrative   Not on file   Social Determinants of Health   Financial Resource Strain: Low Risk  (04/24/2018)   Overall Financial Resource Strain (CARDIA)    Difficulty of Paying Living Expenses: Not hard at all  Food Insecurity: No Food Insecurity (04/24/2018)   Hunger Vital Sign    Worried About Running Out of Food in the Last Year: Never true    Ran Out of Food in the Last Year: Never true  Transportation Needs:  No Transportation Needs (04/24/2018)   PRAPARE - Administrator, Civil Service (Medical): No    Lack of Transportation (Non-Medical): No  Physical Activity: Unknown (04/24/2018)   Exercise Vital Sign    Days of Exercise per Week: 3 days    Minutes of Exercise per Session: Not on file  Stress: No Stress Concern Present (04/24/2018)   [employer] of Occupational Health - Occupational Stress Questionnaire    Feeling of Stress : Not at all  Social Connections: Moderately Integrated (04/24/2018)   Social Connection and Isolation Panel [NHANES]    Frequency of Communication with Friends and Family: Three times a week    Frequency of Social Gatherings with Friends and Family: Three times a week    Attends Religious Services: 1 to 4 times per year    Active Member of Clubs or Organizations: No    Attends Banker Meetings: Never    Marital Status: Living with partner  Intimate Partner Violence: Unknown (04/24/2018)   Humiliation, Afraid, Rape, and Kick questionnaire    Fear of Current or Ex-Partner: Not asked    Emotionally Abused: Not on file    Physically Abused: Not on file    Sexually Abused: Not on  file    Family History  Problem Relation Age of Onset   Asthma Mother    Breast cancer Other    Ovarian cancer Neg Hx    Colon cancer Neg Hx    Diabetes Neg Hx     Allergies  Allergen Reactions   Dust Mite Extract Rash   Tree Extract Rash    Review of Systems  Constitutional:  Positive for malaise/fatigue. Negative for chills, diaphoresis, fever and weight loss.  HENT: Negative.  Negative for congestion, ear discharge, ear pain, hearing loss and tinnitus.   Eyes: Negative.  Negative for blurred vision, double vision, photophobia and pain.  Respiratory: Negative.  Negative for cough and shortness of breath.   Cardiovascular: Negative.  Negative for chest pain, palpitations and leg swelling.  Gastrointestinal: Negative.  Negative for abdominal pain,  constipation, diarrhea, heartburn, nausea and vomiting.  Genitourinary: Negative.  Negative for dysuria and flank pain.  Musculoskeletal: Negative.  Negative for joint pain and myalgias.  Skin: Negative.   Neurological: Negative.  Negative for dizziness and headaches.  Endo/Heme/Allergies: Negative.   Psychiatric/Behavioral: Negative.  Negative for depression and suicidal ideas. The patient is not nervous/anxious.        Objective:   BP 106/78   Pulse 86   Ht 5\' 7"  (1.702 m)   Wt 187 lb 9.6 oz (85.1 kg)   SpO2 98%   BMI 29.38 kg/m   Vitals:   01/25/23 1305  BP: 106/78  Pulse: 86  Height: 5\' 7"  (1.702 m)  Weight: 187 lb 9.6 oz (85.1 kg)  SpO2: 98%  BMI (Calculated): 29.38    Physical Exam Vitals and nursing note reviewed.  Constitutional:      Appearance: Normal appearance.  HENT:     Head: Normocephalic and atraumatic.     Nose: Nose normal.     Mouth/Throat:     Mouth: Mucous membranes are moist.     Pharynx: Oropharynx is clear.  Eyes:     Conjunctiva/sclera: Conjunctivae normal.     Pupils: Pupils are equal, round, and reactive to light.  Cardiovascular:     Rate and Rhythm: Normal rate and regular rhythm.     Pulses: Normal pulses.     Heart sounds: Normal heart sounds. No murmur heard. Pulmonary:     Effort: Pulmonary effort is normal.     Breath sounds: Normal breath sounds. No wheezing.  Abdominal:     General: Bowel sounds are normal.     Palpations: Abdomen is soft.     Tenderness: There is no abdominal tenderness. There is no right CVA tenderness or left CVA tenderness.  Musculoskeletal:        General: Normal range of motion.     Cervical back: Normal range of motion. No rigidity or tenderness.     Right lower leg: No edema.     Left lower leg: No edema.  Lymphadenopathy:     Cervical: No cervical adenopathy.  Skin:    General: Skin is warm and dry.  Neurological:     General: No focal deficit present.     Mental Status: She is alert and  oriented to person, place, and time.  Psychiatric:        Mood and Affect: Mood normal.        Behavior: Behavior normal.      No results found for any visits on 01/25/23.  Recent Results (from the past 2160 hour(s))  CMP14+EGFR     Status: None  Collection Time: 01/11/23  1:42 PM  Result Value Ref Range   Glucose 83 70 - 99 mg/dL   BUN 8 6 - 20 mg/dL   Creatinine, Ser 1.61 0.57 - 1.00 mg/dL   eGFR 96 >09 UE/AVW/0.98   BUN/Creatinine Ratio 10 9 - 23   Sodium 138 134 - 144 mmol/L   Potassium 4.4 3.5 - 5.2 mmol/L   Chloride 102 96 - 106 mmol/L   CO2 22 20 - 29 mmol/L   Calcium 9.7 8.7 - 10.2 mg/dL   Total Protein 7.1 6.0 - 8.5 g/dL   Albumin 4.3 3.9 - 4.9 g/dL   Globulin, Total 2.8 1.5 - 4.5 g/dL   Bilirubin Total 0.4 0.0 - 1.2 mg/dL   Alkaline Phosphatase 61 44 - 121 IU/L   AST 23 0 - 40 IU/L   ALT 9 0 - 32 IU/L  Lipid Panel w/o Chol/HDL Ratio     Status: Abnormal   Collection Time: 01/11/23  1:42 PM  Result Value Ref Range   Cholesterol, Total 175 100 - 199 mg/dL   Triglycerides 66 0 - 149 mg/dL   HDL 61 >11 mg/dL   VLDL Cholesterol Cal 13 5 - 40 mg/dL   LDL Chol Calc (NIH) 914 (H) 0 - 99 mg/dL  POCT Urinalysis Dipstick (78295)     Status: None   Collection Time: 01/11/23  1:42 PM  Result Value Ref Range   Color, UA Yellow    Clarity, UA cloudy    Glucose, UA Negative Negative   Bilirubin, UA Negative    Ketones, UA Negative    Spec Grav, UA 1.025 1.010 - 1.025   Blood, UA Negative    pH, UA 7.0 5.0 - 8.0   Protein, UA Negative Negative   Urobilinogen, UA 1.0 0.2 or 1.0 E.U./dL   Nitrite, UA Negative    Leukocytes, UA Negative Negative   Appearance     Odor    CBC with Differential/Platelet     Status: Abnormal   Collection Time: 01/11/23  1:42 PM  Result Value Ref Range   WBC 4.3 3.4 - 10.8 x10E3/uL   RBC 4.91 3.77 - 5.28 x10E6/uL   Hemoglobin 11.0 (L) 11.1 - 15.9 g/dL   Hematocrit 62.1 30.8 - 46.6 %   MCV 75 (L) 79 - 97 fL   MCH 22.4 (L) 26.6 - 33.0  pg   MCHC 30.0 (L) 31.5 - 35.7 g/dL   RDW 65.7 84.6 - 96.2 %   Platelets 235 150 - 450 x10E3/uL   Neutrophils 35 Not Estab. %   Lymphs 51 Not Estab. %   Monocytes 11 Not Estab. %   Eos 2 Not Estab. %   Basos 1 Not Estab. %   Neutrophils Absolute 1.5 1.4 - 7.0 x10E3/uL   Lymphocytes Absolute 2.2 0.7 - 3.1 x10E3/uL   Monocytes Absolute 0.5 0.1 - 0.9 x10E3/uL   EOS (ABSOLUTE) 0.1 0.0 - 0.4 x10E3/uL   Basophils Absolute 0.0 0.0 - 0.2 x10E3/uL   Immature Granulocytes 0 Not Estab. %   Immature Grans (Abs) 0.0 0.0 - 0.1 x10E3/uL  TSH+T4F+T3Free     Status: None   Collection Time: 01/11/23  1:44 PM  Result Value Ref Range   TSH 1.430 0.450 - 4.500 uIU/mL   T3, Free 2.7 2.0 - 4.4 pg/mL   Free T4 1.21 0.82 - 1.77 ng/dL      Assessment & Plan:  Start iron supplement. Will repeat labs at next follow-up. Problem List Items  Addressed This Visit     Iron deficiency anemia - Primary   Relevant Medications   Iron-FA-B Cmp-C-Biot-Probiotic (FUSION PLUS) CAPS   Seasonal allergic rhinitis due to pollen   Other Visit Diagnoses     Goiter       Relevant Orders   US THYROID   Other fatigue           Return in about 3 months (around 04/27/2023).   Total time spent: 25 minutes  Margaretann Loveless, MD  01/25/2023   This document may have been prepared by [facility] Voice Recognition software and as such may include unintentional dictation errors.

## 2023-02-06 ENCOUNTER — Ambulatory Visit: Payer: Managed Care, Other (non HMO) | Admitting: Obstetrics & Gynecology

## 2023-02-06 ENCOUNTER — Encounter: Payer: Self-pay | Admitting: Obstetrics & Gynecology

## 2023-02-06 ENCOUNTER — Other Ambulatory Visit (HOSPITAL_COMMUNITY)
Admission: RE | Admit: 2023-02-06 | Discharge: 2023-02-06 | Disposition: A | Discharge status: Home / Self Care | Payer: Managed Care, Other (non HMO) | Source: Ambulatory Visit | Attending: Obstetrics & Gynecology | Admitting: Obstetrics & Gynecology

## 2023-02-06 VITALS — BP 100/57 | HR 77 | Ht 67.0 in | Wt 185.0 lb

## 2023-02-06 DIAGNOSIS — Z23 Encounter for immunization: Secondary | ICD-10-CM

## 2023-02-06 DIAGNOSIS — R87810 Cervical high risk human papillomavirus (HPV) DNA test positive: Secondary | ICD-10-CM | POA: Insufficient documentation

## 2023-02-06 DIAGNOSIS — Z3202 Encounter for pregnancy test, result negative: Secondary | ICD-10-CM

## 2023-02-06 DIAGNOSIS — Z113 Encounter for screening for infections with a predominantly sexual mode of transmission: Secondary | ICD-10-CM

## 2023-02-06 DIAGNOSIS — R87610 Atypical squamous cells of undetermined significance on cytologic smear of cervix (ASC-US): Secondary | ICD-10-CM | POA: Diagnosis not present

## 2023-02-06 DIAGNOSIS — Z0000 Encounter for general adult medical examination without abnormal findings: Secondary | ICD-10-CM

## 2023-02-06 LAB — POCT URINE PREGNANCY: Preg Test, Ur: NEGATIVE

## 2023-02-06 MED ORDER — METRONIDAZOLE 500 MG PO TABS: 500.0000 mg | ORAL_TABLET | Freq: Two times a day (BID) | ORAL | 0 refills | Status: DC

## 2023-02-06 NOTE — Addendum Note (Signed)
Addended by: Cornelius Moras D on: 02/06/2023 02:33 PM   Modules accepted: Orders

## 2023-02-06 NOTE — Progress Notes (Signed)
    GYNECOLOGY PROGRESS NOTE  Subjective:    Patient ID: Allison [name], female    DOB: [birth year], 32 y.o.   MRN: [id number]  HPI  Patient is a 32 y.o.single G3P2002 here today for a colpo. She had a ASCUS + HR HPV pap in 2022. She reports a h/o an abnormal pap when she was 32 years old and she had a "biopsy" then but no treatment.  She has not had Gardasil in the past and would like to start it now.  She has been with her current partner for 4 months and uses condoms. She has not had recent STI testing.  The following portions of the patient's history were reviewed and updated as appropriate: allergies, current medications, past family history, past medical history, past social history, past surgical history, and problem list.  Review of Systems Pertinent items are noted in HPI.   Objective:   Blood pressure (!) 100/57, pulse 77, height 5\' 7"  (1.702 m), weight 185 lb (83.9 kg), unknown if currently breastfeeding. Body mass index is 28.98 kg/m. Well nourished, well hydrated Black female, no apparent distress She is ambulating and conversing normally. UPT negative, consent signed, time out done Cervix prepped with acetic acid. Transformation zone seen in its entirety. Colpo adequate. Ectocervix all normal with colposcopy ECC obtained. She tolerated the procedure well.  Frothy white vaginal discharge noted, c/w BV (which she has had in the past).   Assessment:   1. ASCUS with positive high risk HPV cervical     2.      Screening for STI  3.      Vaginal discharge - treat with flagyl, await Aptima testing  Plan:   1. ASCUS with positive high risk HPV cervical

## 2023-02-07 LAB — RPR: RPR Ser Ql: NONREACTIVE

## 2023-02-07 LAB — HEPATITIS B SURFACE ANTIGEN: Hepatitis B Surface Ag: NEGATIVE

## 2023-02-07 LAB — HIV ANTIBODY (ROUTINE TESTING W REFLEX): HIV Screen 4th Generation wRfx: NONREACTIVE

## 2023-02-07 LAB — HEPATITIS C ANTIBODY: Hep C Virus Ab: NONREACTIVE

## 2023-02-08 LAB — CERVICOVAGINAL ANCILLARY ONLY
Bacterial Vaginitis (gardnerella): POSITIVE — AB
Candida Glabrata: NEGATIVE
Candida Vaginitis: POSITIVE — AB
Chlamydia: NEGATIVE
Comment: NEGATIVE
Comment: NEGATIVE
Comment: NEGATIVE
Comment: NEGATIVE
Comment: NEGATIVE
Comment: NORMAL
Neisseria Gonorrhea: NEGATIVE
Trichomonas: NEGATIVE

## 2023-02-08 LAB — SURGICAL PATHOLOGY

## 2023-02-13 ENCOUNTER — Encounter: Payer: Self-pay | Admitting: Obstetrics & Gynecology

## 2023-02-13 ENCOUNTER — Other Ambulatory Visit: Payer: Self-pay | Admitting: Obstetrics & Gynecology

## 2023-02-13 DIAGNOSIS — B3731 Acute candidiasis of vulva and vagina: Secondary | ICD-10-CM

## 2023-02-13 MED ORDER — FLUCONAZOLE 150 MG PO TABS: 150.0000 mg | ORAL_TABLET | Freq: Once | ORAL | 0 refills | Status: AC

## 2023-02-13 NOTE — Progress Notes (Signed)
Diflucan prescribed

## 2023-02-14 ENCOUNTER — Ambulatory Visit (INDEPENDENT_AMBULATORY_CARE_PROVIDER_SITE_OTHER): Payer: Managed Care, Other (non HMO)

## 2023-02-14 DIAGNOSIS — E049 Nontoxic goiter, unspecified: Secondary | ICD-10-CM | POA: Diagnosis not present

## 2023-02-20 NOTE — Progress Notes (Signed)
Patient notified

## 2023-03-14 ENCOUNTER — Encounter: Payer: Self-pay | Admitting: Internal Medicine

## 2023-03-28 ENCOUNTER — Encounter: Payer: Self-pay | Admitting: Internal Medicine

## 2023-04-09 ENCOUNTER — Ambulatory Visit (INDEPENDENT_AMBULATORY_CARE_PROVIDER_SITE_OTHER): Payer: Managed Care, Other (non HMO)

## 2023-04-09 ENCOUNTER — Ambulatory Visit: Payer: Managed Care, Other (non HMO)

## 2023-04-09 VITALS — BP 108/70 | HR 105 | Ht 67.0 in | Wt 186.7 lb

## 2023-04-09 DIAGNOSIS — Z23 Encounter for immunization: Secondary | ICD-10-CM

## 2023-04-09 NOTE — Patient Instructions (Signed)
Human Papillomavirus (HPV) Vaccine Injection What is this medication? HUMAN PAPILLOMAVIRUS VACCINE (HYOO muhn pap uh LOH muh vahy ruhs vak SEEN) reduces the risk of human papillomavirus (HPV). It does not treat HPV. It is still possible to get HPV after receiving this vaccine, but the symptoms may be less severe or not last as long. It works by helping your immune system learn how to fight off a future infection. This medicine may be used for other purposes; ask your health care provider or pharmacist if you have questions. COMMON BRAND NAME(S): Gardasil 9 What should I tell my care team before I take this medication? They need to know if you have any of these conditions: Fever Hemophilia HIV or AIDS Immune system problems Infection Low platelets An unusual reaction to human papillomavirus vaccine, yeast, other vaccines, other medications, foods, dyes, or preservatives Pregnant or trying to get pregnant Breastfeeding How should I use this medication? This vaccine is injected into a muscle. It is given by your care team. This vaccine requires 2 or 3 doses to get the full benefit. Set a reminder for when your next dose is due. A copy of the Vaccine Information Statement will be given before each vaccination. Be sure to read this information carefully each time. This sheet may change often. Talk to your care team about the use of this medication in children. While it may be prescribed for children as young as 9 years for selected conditions, precautions do apply. Overdosage: If you think you have taken too much of this medicine contact a poison control center or emergency room at once. NOTE: This medicine is only for you. Do not share this medicine with others. What if I miss a dose? Keep appointments for follow-up doses as directed. It is important not to miss your dose. Call your care team if you are unable to keep an appointment. What may interact with this medication? Certain medications  for arthritis Medications for organ transplant Medications to treat cancer Steroid medications, such as prednisone or cortisone This list may not describe all possible interactions. Give your health care provider a list of all the medicines, herbs, non-prescription drugs, or dietary supplements you use. Also tell them if you smoke, drink alcohol, or use illegal drugs. Some items may interact with your medicine. What should I watch for while using this medication? Visit your care team regularly. Report any side effects to your care team right away. This vaccine, like all vaccines, may not fully protect everyone. What side effects may I notice from receiving this medication? Side effects that you should report to your care team as soon as possible: Allergic reactions--skin rash, itching, hives, swelling of the face, lips, tongue, or throat Feeling faint or lightheaded Side effects that usually do not require medical attention (report these to your care team if they continue or are bothersome): Diarrhea Dizziness Fatigue Fever Headache Nausea Pain, redness, irritation, or bruising at the injection site This list may not describe all possible side effects. Call your doctor for medical advice about side effects. You may report side effects to FDA at 1-800-FDA-1088. Where should I keep my medication? This vaccine is only given by your care team. It will not be stored at home. NOTE: This sheet is a summary. It may not cover all possible information. If you have questions about this medicine, talk to your doctor, pharmacist, or health care provider.  2024 Elsevier/Gold Standard (2021-10-26 00:00:00)

## 2023-04-09 NOTE — Progress Notes (Signed)
    NURSE VISIT NOTE  Subjective:    Patient ID: Allison [name], female    DOB: [birth year], 32 y.o.   MRN: [id number]  HPI  Patient is a 32 y.o. G2P2002 female Single African American female who presents for her second Gardasil injection. Order to administer given by Nicholaus Bloom, MD on 02/06/23.   Objective:    BP 108/70   Pulse (!) 105   Ht 5\' 7"  (1.702 m)   Wt 186 lb 11.2 oz (84.7 kg)   BMI 29.24 kg/m   32 y.o. LMP:  04/08/23  Contraception:   Condoms Given by: Rocco Serene, LPN Site:  right deltoid  Lab Review    Assessment:   1. Need for HPV vaccination      Plan:   Patient will return in 4 months for third injection.    Rocco Serene, LPN

## 2023-04-27 ENCOUNTER — Ambulatory Visit: Payer: Managed Care, Other (non HMO) | Admitting: Internal Medicine

## 2023-05-04 ENCOUNTER — Encounter: Payer: Self-pay | Admitting: Internal Medicine

## 2023-05-04 ENCOUNTER — Ambulatory Visit (INDEPENDENT_AMBULATORY_CARE_PROVIDER_SITE_OTHER): Payer: Managed Care, Other (non HMO) | Admitting: Internal Medicine

## 2023-05-04 VITALS — BP 120/80 | HR 90 | Ht 67.0 in | Wt 191.2 lb

## 2023-05-04 DIAGNOSIS — D508 Other iron deficiency anemias: Secondary | ICD-10-CM

## 2023-05-04 DIAGNOSIS — Z0130 Encounter for examination of blood pressure without abnormal findings: Secondary | ICD-10-CM

## 2023-05-04 NOTE — Progress Notes (Signed)
Established Patient Office Visit  Subjective:  Patient ID: Allison [name], female    DOB: 08-01-1990  Age: 32 y.o. MRN: [id number]  Chief Complaint  Patient presents with   Follow-up    3 month    Patient comes in for her follow-up today.  She feels well and has no new complaints.  However reports that she is not taking her iron pill regularly as she is supposed to.  However we will resume now.  Will check labs today.  Denies chest pain, no shortness of breath, no headaches and no dizziness.    No other concerns at this time.   Past Medical History:  Diagnosis Date   Anemia    Asthma    exercise induced, inhaler use 8 years ago    Past Surgical History:  Procedure Laterality Date   WISDOM TOOTH EXTRACTION      Social History   Socioeconomic History   Marital status: Single    Spouse name: Not on file   Number of children: Not on file   Years of education: Not on file   Highest education level: Not on file  Occupational History    Employer: [employer]  Tobacco Use   Smoking status: Never    Passive exposure: Past   Smokeless tobacco: Never  Vaping Use   Vaping status: Never Used  Substance and Sexual Activity   Alcohol use: No   Drug use: No   Sexual activity: Yes    Birth control/protection: None    Comment: undecided  Other Topics Concern   Not on file  Social History Narrative   Not on file   Social Determinants of Health   Financial Resource Strain: Low Risk  (04/24/2018)   Overall Financial Resource Strain (CARDIA)    Difficulty of Paying Living Expenses: Not hard at all  Food Insecurity: No Food Insecurity (04/24/2018)   Hunger Vital Sign    Worried About Running Out of Food in the Last Year: Never true    Ran Out of Food in the Last Year: Never true  Transportation Needs: No Transportation Needs (04/24/2018)   PRAPARE - Administrator, Civil Service (Medical): No    Lack of Transportation (Non-Medical): No  Physical  Activity: Unknown (04/24/2018)   Exercise Vital Sign    Days of Exercise per Week: 3 days    Minutes of Exercise per Session: Not on file  Stress: No Stress Concern Present (04/24/2018)   [employer] of Occupational Health - Occupational Stress Questionnaire    Feeling of Stress : Not at all  Social Connections: Moderately Integrated (04/24/2018)   Social Connection and Isolation Panel [NHANES]    Frequency of Communication with Friends and Family: Three times a week    Frequency of Social Gatherings with Friends and Family: Three times a week    Attends Religious Services: 1 to 4 times per year    Active Member of Clubs or Organizations: No    Attends Banker Meetings: Never    Marital Status: Living with partner  Intimate Partner Violence: Unknown (04/24/2018)   Humiliation, Afraid, Rape, and Kick questionnaire    Fear of Current or Ex-Partner: Not asked    Emotionally Abused: Not on file    Physically Abused: Not on file    Sexually Abused: Not on file    Family History  Problem Relation Age of Onset   Asthma Mother    Breast cancer Other    Ovarian  cancer Neg Hx    Colon cancer Neg Hx    Diabetes Neg Hx     Allergies  Allergen Reactions   Dust Mite Extract Rash   Tree Extract Rash    Outpatient Medications Prior to Visit  Medication Sig   Iron-FA-B Cmp-C-Biot-Probiotic (FUSION PLUS) CAPS Take 1 capsule by mouth daily.   [DISCONTINUED] metroNIDAZOLE (FLAGYL) 500 MG tablet Take 1 tablet (500 mg total) by mouth 2 (two) times daily.   No facility-administered medications prior to visit.    Review of Systems  Constitutional: Negative.  Negative for chills, fever and malaise/fatigue.  HENT: Negative.  Negative for ear pain, sinus pain and sore throat.   Eyes: Negative.   Respiratory: Negative.  Negative for cough, shortness of breath and stridor.   Cardiovascular: Negative.  Negative for chest pain, palpitations and leg swelling.  Gastrointestinal:  Negative.  Negative for abdominal pain, constipation, diarrhea, heartburn, nausea and vomiting.  Genitourinary: Negative.  Negative for dysuria and flank pain.  Musculoskeletal: Negative.  Negative for joint pain and myalgias.  Skin: Negative.   Neurological: Negative.  Negative for dizziness and headaches.  Endo/Heme/Allergies: Negative.   Psychiatric/Behavioral: Negative.  Negative for depression and suicidal ideas. The patient is not nervous/anxious.        Objective:   BP 120/80   Pulse 90   Ht 5\' 7"  (1.702 m)   Wt 191 lb 3.2 oz (86.7 kg)   LMP 04/08/2023   SpO2 97%   BMI 29.95 kg/m   Vitals:   05/04/23 1449  BP: 120/80  Pulse: 90  Height: 5\' 7"  (1.702 m)  Weight: 191 lb 3.2 oz (86.7 kg)  SpO2: 97%  BMI (Calculated): 29.94    Physical Exam Vitals and nursing note reviewed.  Constitutional:      Appearance: Normal appearance.  HENT:     Head: Normocephalic and atraumatic.     Nose: Nose normal.     Mouth/Throat:     Mouth: Mucous membranes are moist.     Pharynx: Oropharynx is clear.  Eyes:     Conjunctiva/sclera: Conjunctivae normal.     Pupils: Pupils are equal, round, and reactive to light.  Cardiovascular:     Rate and Rhythm: Normal rate and regular rhythm.     Pulses: Normal pulses.     Heart sounds: Normal heart sounds. No murmur heard. Pulmonary:     Effort: Pulmonary effort is normal.     Breath sounds: Normal breath sounds. No wheezing.  Abdominal:     General: Bowel sounds are normal.     Palpations: Abdomen is soft.     Tenderness: There is no abdominal tenderness. There is no right CVA tenderness or left CVA tenderness.  Musculoskeletal:        General: Normal range of motion.     Cervical back: Normal range of motion.     Right lower leg: No edema.     Left lower leg: No edema.  Skin:    General: Skin is warm and dry.  Neurological:     General: No focal deficit present.     Mental Status: She is alert and oriented to person, place,  and time.  Psychiatric:        Mood and Affect: Mood normal.        Behavior: Behavior normal.      No results found for any visits on 05/04/23.  Recent Results (from the past 2160 hour(s))  Cervicovaginal ancillary only  Status: Abnormal   Collection Time: 02/06/23  2:12 PM  Result Value Ref Range   Neisseria Gonorrhea Negative    Chlamydia Negative    Trichomonas Negative    Bacterial Vaginitis (gardnerella) Positive (A)    Candida Vaginitis Positive (A)    Candida Glabrata Negative    Comment      Normal Reference Range Bacterial Vaginosis - Negative   Comment Normal Reference Range Candida Species - Negative    Comment Normal Reference Range Candida Galbrata - Negative    Comment Normal Reference Range Trichomonas - Negative    Comment Normal Reference Ranger Chlamydia - Negative    Comment      Normal Reference Range Neisseria Gonorrhea - Negative  POCT urine pregnancy     Status: Normal   Collection Time: 02/06/23  2:31 PM  Result Value Ref Range   Preg Test, Ur Negative Negative  Surgical pathology     Status: None   Collection Time: 02/06/23  2:33 PM  Result Value Ref Range   SURGICAL PATHOLOGY      SURGICAL PATHOLOGY CASE: [id number] PATIENT: [name] Surgical Pathology Report     Clinical History: ASCUS with positive high risk HPV, preventative health care (cm)     FINAL MICROSCOPIC DIAGNOSIS:  A. ENDOCERVIX, CURETTAGE: Benign endocervical mucosa.   GROSS DESCRIPTION:  A. Received in formalin labeled with the patients name and "ECC" is a 1.0 x 0.8 x .02 cm aggregate of tan soft tissue fragments engrossed in mucoid material, submitted in toto in a single cassette.  (LEF 02/07/2023)  Final Diagnosis performed by Jimmy Picket, MD.   Electronically signed 02/08/2023 Technical and / or Professional components performed at [facility]. [facility], [address] [place], [place] [address].  Immunohistochemistry Technical  component (if applicable) was performed at [facility]. [address], [address], [place], [place] [address].   IMMUNOHISTOCHEMISTRY DISCLAIMER (if applicable): Some of these immunohistochemical st ains may have been developed and the performance characteristics determine by [facility]. Some may not have been cleared or approved by the U.S. Food and Drug Administration. The FDA has determined that such clearance or approval is not necessary. This test is used for clinical purposes. It should not be regarded as investigational or for research. This laboratory is certified under the Clinical Laboratory Improvement Amendments of 1988 (CLIA-88) as qualified to perform high complexity clinical laboratory testing.  The controls stained appropriately.   IHC stains are performed on formalin fixed, paraffin embedded tissue using a 3,3"diaminobenzidine (DAB) chromogen and Leica Bond Autostainer System. The staining intensity of the nucleus is score manually and is reported as the percentage of tumor cell nuclei demonstrating specific nuclear staining. The specimens are fixed in 10% Neutral Formalin for at least 6 hours and up to 72hrs. These tests are validated  on decalcified tissue. Results should be interpreted with caution given the possibility of false negative results on decalcified specimens. Antibody Clones are as follows ER-clone 48F, PR-clone 16, Ki67- clone MM1. Some of these immunohistochemical stains may have been developed and the performance characteristics determined by [facility].   HIV antibody (with reflex)     Status: None   Collection Time: 02/06/23  2:34 PM  Result Value Ref Range   HIV Screen 4th Generation wRfx Non Reactive Non Reactive    Comment: HIV-1/HIV-2 antibodies and HIV-1 p24 antigen were NOT detected. There is no laboratory evidence of HIV infection. HIV Negative   Hepatitis B surface antigen  Status: None    Collection Time: 02/06/23  2:34 PM  Result Value Ref Range   Hepatitis B Surface Ag Negative Negative  Hepatitis C Antibody     Status: None   Collection Time: 02/06/23  2:34 PM  Result Value Ref Range   Hep C Virus Ab Non Reactive Non Reactive    Comment: HCV antibody alone does not differentiate between previously resolved infection and active infection. Equivocal and Reactive HCV antibody results should be followed up with an HCV RNA test to support the diagnosis of active HCV infection.   RPR     Status: None   Collection Time: 02/06/23  2:34 PM  Result Value Ref Range   RPR Ser Ql Non Reactive Non Reactive      Assessment & Plan:  Check labs today.  Resume iron supplement. Problem List Items Addressed This Visit     Iron deficiency anemia - Primary   Relevant Orders   CBC with Diff   Iron and IBC ([id number])   Ferritin    Return in about 6 months (around 11/02/2023).   Total time spent: 25 minutes  Margaretann Loveless, MD  05/04/2023   This document may have been prepared by [facility] Voice Recognition software and as such may include unintentional dictation errors.

## 2023-05-05 LAB — CBC WITH DIFFERENTIAL/PLATELET
Basophils Absolute: 0.1 10*3/uL (ref 0.0–0.2)
Basos: 1 %
EOS (ABSOLUTE): 0.1 10*3/uL (ref 0.0–0.4)
Eos: 1 %
Hematocrit: 36.5 % (ref 34.0–46.6)
Hemoglobin: 10.9 g/dL — ABNORMAL LOW (ref 11.1–15.9)
Immature Grans (Abs): 0 10*3/uL (ref 0.0–0.1)
Immature Granulocytes: 0 %
Lymphocytes Absolute: 2.3 10*3/uL (ref 0.7–3.1)
Lymphs: 31 %
MCH: 22.7 pg — ABNORMAL LOW (ref 26.6–33.0)
MCHC: 29.9 g/dL — ABNORMAL LOW (ref 31.5–35.7)
MCV: 76 fL — ABNORMAL LOW (ref 79–97)
Monocytes Absolute: 0.5 10*3/uL (ref 0.1–0.9)
Monocytes: 7 %
Neutrophils Absolute: 4.3 10*3/uL (ref 1.4–7.0)
Neutrophils: 60 %
Platelets: 271 10*3/uL (ref 150–450)
RBC: 4.8 x10E6/uL (ref 3.77–5.28)
RDW: 14 % (ref 11.7–15.4)
WBC: 7.3 10*3/uL (ref 3.4–10.8)

## 2023-05-05 LAB — IRON AND TIBC
Iron Saturation: 14 % — ABNORMAL LOW (ref 15–55)
Iron: 50 ug/dL (ref 27–159)
Total Iron Binding Capacity: 364 ug/dL (ref 250–450)
UIBC: 314 ug/dL (ref 131–425)

## 2023-05-05 LAB — FERRITIN: Ferritin: 35 ng/mL (ref 15–150)

## 2023-08-06 ENCOUNTER — Ambulatory Visit (INDEPENDENT_AMBULATORY_CARE_PROVIDER_SITE_OTHER): Payer: Managed Care, Other (non HMO)

## 2023-08-06 VITALS — BP 100/69 | HR 76 | Ht 67.0 in | Wt 190.7 lb

## 2023-08-06 DIAGNOSIS — Z23 Encounter for immunization: Secondary | ICD-10-CM

## 2023-08-06 NOTE — Progress Notes (Signed)
    NURSE VISIT NOTE  Subjective:    Patient ID: Allison [name], female    DOB: 04-Apr-1991, 33 y.o.   MRN: [id number]  HPI  Patient is a 33 y.o. G62P2002 female Single African American female who presents for her third Gardasil injection. Order to administer given by Nicholaus Bloom, MD on 02/06/23.   Objective:    BP 100/69   Pulse 76   Ht 5\' 7"  (1.702 m)   Wt 190 lb 11.2 oz (86.5 kg)   LMP  (LMP Unknown)   BMI 29.87 kg/m   Given by: Georgiana Shore, CMA Site:  right deltoid    Assessment:   1. Need for HPV vaccination      Plan:   Patient will return in prn. Gardasil series complete.    Loman Chroman, CMA

## 2023-08-17 NOTE — Progress Notes (Signed)
    GYNECOLOGY PROGRESS NOTE  Subjective:  PCP: Margaretann Loveless, MD  Patient ID: Allison [name], female    DOB: 03/08/1991, 33 y.o.   MRN: [id number]  HPI  Patient is a 33 y.o. G84P2002 female who presents for heavy periods. She has regular Q28d cycles, will last 7 days, flow is heavy, needing to change her ultra tampon Q2-3hrs. Cramping is mild, used to be worse. Prior to her pregnancies, periods were like this and she took Loestrin, cycles lightened and shortened to 3days. Hasn't been on contraception for the past several years and is unsure if she has completed childbearing. [facility] of fibroids in her mother, who eventually needed hysterectomy. She is desiring ultrasound at this time. Her most recent cycle, 08/02/23, was on time, same flow, but only lasted 1 day. Is not having intermenstrual bleeding and is not skipping cycles.   Pap from 12/06/2020: ASCUS, HPV+ (16/18/45 neg), colposcopy 01/2023 NEG.   Period Cycle (Days): 30 Period Duration (Days): 7 Period Pattern: Regular Menstrual Flow: Heavy Menstrual Control: Tampon Menstrual Control Change Freq (Hours): 2-3 Dysmenorrhea: (!) Mild Dysmenorrhea Symptoms: Cramping  The following portions of the patient's history were reviewed and updated as appropriate: allergies, current medications, past family history, past medical history, past social history, past surgical history, and problem list.  Review of Systems Pertinent items are noted in HPI.   Objective:   Blood pressure 105/63, pulse 82, height 5\' 7"  (1.702 m), weight 191 lb (86.6 kg), last menstrual period 08/02/2023, unknown if currently breastfeeding. Body mass index is 29.91 kg/m.  General appearance: alert and cooperative Pelvic: deferred Extremities: extremities normal, atraumatic, no cyanosis or edema Neurologic: Grossly normal  Assessment/Plan:   1. Menorrhagia with regular cycle   Discussed management options for heavy menstrual bleeding including expectant, NSAIDs,  tranexamic acid (Lysteda), oral progesterone (Provera, norethindrone, megace), hormonal contraception. Pt unsure of childbearing, so deferred discussing procedural options. Final management decision will hinge on results of patient's work up and whether an underlying etiology for the patients bleeding symptoms can be discerned.  We will conduct a basic work up examining using the PALM-COIEN classification system.  In the meantime the patient opts to trial expectant while we await results of her ultrasound. Bleeding precautions reviewed.     Julieanne Manson, DO [facility]

## 2023-08-20 ENCOUNTER — Encounter: Payer: Self-pay | Admitting: Obstetrics

## 2023-08-20 ENCOUNTER — Ambulatory Visit (INDEPENDENT_AMBULATORY_CARE_PROVIDER_SITE_OTHER): Admitting: Obstetrics

## 2023-08-20 VITALS — BP 105/63 | HR 82 | Ht 67.0 in | Wt 191.0 lb

## 2023-08-20 DIAGNOSIS — N920 Excessive and frequent menstruation with regular cycle: Secondary | ICD-10-CM

## 2023-08-22 ENCOUNTER — Ambulatory Visit
Admission: RE | Admit: 2023-08-22 | Discharge: 2023-08-22 | Disposition: A | Discharge status: Home / Self Care | Source: Ambulatory Visit | Attending: Obstetrics | Admitting: Obstetrics

## 2023-08-22 DIAGNOSIS — N920 Excessive and frequent menstruation with regular cycle: Secondary | ICD-10-CM | POA: Diagnosis not present

## 2023-08-22 DIAGNOSIS — N939 Abnormal uterine and vaginal bleeding, unspecified: Secondary | ICD-10-CM | POA: Diagnosis not present

## 2023-08-23 ENCOUNTER — Encounter: Payer: Self-pay | Admitting: Obstetrics

## 2023-10-18 ENCOUNTER — Ambulatory Visit (INDEPENDENT_AMBULATORY_CARE_PROVIDER_SITE_OTHER): Admitting: Internal Medicine

## 2023-10-18 ENCOUNTER — Encounter: Payer: Self-pay | Admitting: Internal Medicine

## 2023-10-18 VITALS — BP 112/70 | HR 93 | Ht 67.0 in | Wt 196.0 lb

## 2023-10-18 DIAGNOSIS — B820 Intestinal helminthiasis, unspecified: Secondary | ICD-10-CM | POA: Diagnosis not present

## 2023-10-18 DIAGNOSIS — D508 Other iron deficiency anemias: Secondary | ICD-10-CM

## 2023-10-18 DIAGNOSIS — F411 Generalized anxiety disorder: Secondary | ICD-10-CM

## 2023-10-18 DIAGNOSIS — E559 Vitamin D deficiency, unspecified: Secondary | ICD-10-CM | POA: Diagnosis not present

## 2023-10-18 MED ORDER — PAROXETINE HCL 10 MG PO TABS: 10.0000 mg | ORAL_TABLET | Freq: Every day | ORAL | 2 refills | Status: DC

## 2023-10-18 NOTE — Progress Notes (Signed)
 Established Patient Office Visit  Subjective:  Patient ID: Allison [name], female    DOB: [birth year]  Age: 33 y.o. MRN: [id number]  Chief Complaint  Patient presents with   Follow-up    Anxiety    Patient comes in for follow-up today.  Today she admits to increased anxiety and panic attack.  Deviously she was not willing to take any medications.  However today she agrees to start a small dose of Paxil.  Patient also notes that she found something in her stool, suggestive of a tapeworm.  Will check stool for ova and parasites.  Patient has a history of iron  deficiency anemia, but has been skipping her iron  tablets as well.  She will resume from today.  Will check labs also.    No other concerns at this time.   Past Medical History:  Diagnosis Date   Anemia    Asthma    exercise induced, inhaler use 8 years ago    Past Surgical History:  Procedure Laterality Date   WISDOM TOOTH EXTRACTION      Social History   Socioeconomic History   Marital status: Single    Spouse name: Not on file   Number of children: Not on file   Years of education: Not on file   Highest education level: Not on file  Occupational History    Employer: [employer]  Tobacco Use   Smoking status: Never    Passive exposure: Past   Smokeless tobacco: Never  Vaping Use   Vaping status: Never Used  Substance and Sexual Activity   Alcohol use: No   Drug use: No   Sexual activity: Yes    Birth control/protection: None    Comment: undecided  Other Topics Concern   Not on file  Social History Narrative   Not on file   Social Drivers of Health   Financial Resource Strain: Low Risk  (04/24/2018)   Overall Financial Resource Strain (CARDIA)    Difficulty of Paying Living Expenses: Not hard at all  Food Insecurity: No Food Insecurity (04/24/2018)   Hunger Vital Sign    Worried About Running Out of Food in the Last Year: Never true    Ran Out of Food in the Last Year: Never true   Transportation Needs: No Transportation Needs (04/24/2018)   PRAPARE - Administrator, Civil Service (Medical): No    Lack of Transportation (Non-Medical): No  Physical Activity: Unknown (04/24/2018)   Exercise Vital Sign    Days of Exercise per Week: 3 days    Minutes of Exercise per Session: Not on file  Stress: No Stress Concern Present (04/24/2018)   [employer] of Occupational Health - Occupational Stress Questionnaire    Feeling of Stress : Not at all  Social Connections: Moderately Integrated (04/24/2018)   Social Connection and Isolation Panel [NHANES]    Frequency of Communication with Friends and Family: Three times a week    Frequency of Social Gatherings with Friends and Family: Three times a week    Attends Religious Services: 1 to 4 times per year    Active Member of Clubs or Organizations: No    Attends Banker Meetings: Never    Marital Status: Living with partner  Intimate Partner Violence: Unknown (04/24/2018)   Humiliation, Afraid, Rape, and Kick questionnaire    Fear of Current or Ex-Partner: Not asked    Emotionally Abused: Not on file    Physically Abused: Not on file  Sexually Abused: Not on file    Family History  Problem Relation Age of Onset   Asthma Mother    Breast cancer Other    Ovarian cancer Neg Hx    Colon cancer Neg Hx    Diabetes Neg Hx     Allergies  Allergen Reactions   Dust Mite Extract Rash   Tree Extract Rash    Outpatient Medications Prior to Visit  Medication Sig   Iron -FA-B Cmp-C-Biot-Probiotic (FUSION PLUS) CAPS Take 1 capsule by mouth daily.   No facility-administered medications prior to visit.    Review of Systems  Constitutional:  Positive for malaise/fatigue. Negative for chills, diaphoresis, fever and weight loss.  HENT: Negative.  Negative for ear discharge and sore throat.   Eyes: Negative.   Respiratory: Negative.  Negative for cough and shortness of breath.   Cardiovascular:  Negative.  Negative for chest pain, palpitations and leg swelling.  Gastrointestinal: Negative.  Negative for abdominal pain, constipation, diarrhea, heartburn, nausea and vomiting.  Genitourinary: Negative.  Negative for dysuria and flank pain.  Musculoskeletal: Negative.  Negative for joint pain and myalgias.  Skin: Negative.   Neurological: Negative.  Negative for dizziness, tingling, tremors, sensory change and headaches.  Endo/Heme/Allergies: Negative.   Psychiatric/Behavioral:  Negative for depression and suicidal ideas.        Objective:   BP 112/70   Pulse 93   Ht 5\' 7"  (1.702 m)   Wt 196 lb (88.9 kg)   SpO2 98%   BMI 30.70 kg/m   Vitals:   10/18/23 1349  BP: 112/70  Pulse: 93  Height: 5\' 7"  (1.702 m)  Weight: 196 lb (88.9 kg)  SpO2: 98%  BMI (Calculated): 30.69    Physical Exam Vitals and nursing note reviewed.  Constitutional:      Appearance: Normal appearance.  HENT:     Head: Normocephalic and atraumatic.     Nose: Nose normal.     Mouth/Throat:     Mouth: Mucous membranes are moist.     Pharynx: Oropharynx is clear.  Eyes:     Conjunctiva/sclera: Conjunctivae normal.     Pupils: Pupils are equal, round, and reactive to light.  Cardiovascular:     Rate and Rhythm: Normal rate and regular rhythm.     Pulses: Normal pulses.     Heart sounds: Normal heart sounds. No murmur heard. Pulmonary:     Effort: Pulmonary effort is normal.     Breath sounds: Normal breath sounds. No wheezing.  Abdominal:     General: Bowel sounds are normal.     Palpations: Abdomen is soft.     Tenderness: There is no abdominal tenderness. There is no right CVA tenderness or left CVA tenderness.  Musculoskeletal:        General: Normal range of motion.     Cervical back: Normal range of motion.     Right lower leg: No edema.     Left lower leg: No edema.  Skin:    General: Skin is warm and dry.  Neurological:     General: No focal deficit present.     Mental Status:  She is alert and oriented to person, place, and time.  Psychiatric:        Mood and Affect: Mood normal.        Behavior: Behavior normal.      No results found for any visits on 10/18/23.  No results found for this or any previous visit (from the past 2160 hours).  Assessment & Plan:  Start Paxil.  Resume iron  supplement.  Check labs. Problem List Items Addressed This Visit     Iron  deficiency anemia - Primary   Relevant Orders   CBC with Diff   Iron , TIBC and Ferritin Panel   GAD (generalized anxiety disorder)   Relevant Medications   PARoxetine (PAXIL) 10 MG tablet   Other Visit Diagnoses       Vitamin D deficiency       Relevant Orders   Vitamin D (25 hydroxy)     Intestinal worms       Relevant Orders   Culture, Stool   Ova and parasite examination       Return in about 2 weeks (around 11/01/2023).   Total time spent: 30 minutes  Aisha Hove, MD  10/18/2023   This document may have been prepared by [facility] Voice Recognition software and as such may include unintentional dictation errors.

## 2023-10-19 ENCOUNTER — Ambulatory Visit: Payer: Self-pay | Admitting: Internal Medicine

## 2023-10-19 DIAGNOSIS — E559 Vitamin D deficiency, unspecified: Secondary | ICD-10-CM

## 2023-10-19 LAB — CBC WITH DIFFERENTIAL/PLATELET
Basophils Absolute: 0.1 10*3/uL (ref 0.0–0.2)
Basos: 1 %
EOS (ABSOLUTE): 0.1 10*3/uL (ref 0.0–0.4)
Eos: 1 %
Hematocrit: 37 % (ref 34.0–46.6)
Hemoglobin: 10.9 g/dL — ABNORMAL LOW (ref 11.1–15.9)
Immature Grans (Abs): 0 10*3/uL (ref 0.0–0.1)
Immature Granulocytes: 0 %
Lymphocytes Absolute: 2.6 10*3/uL (ref 0.7–3.1)
Lymphs: 46 %
MCH: 22.9 pg — ABNORMAL LOW (ref 26.6–33.0)
MCHC: 29.5 g/dL — ABNORMAL LOW (ref 31.5–35.7)
MCV: 78 fL — ABNORMAL LOW (ref 79–97)
Monocytes Absolute: 0.4 10*3/uL (ref 0.1–0.9)
Monocytes: 8 %
Neutrophils Absolute: 2.4 10*3/uL (ref 1.4–7.0)
Neutrophils: 44 %
Platelets: 245 10*3/uL (ref 150–450)
RBC: 4.77 x10E6/uL (ref 3.77–5.28)
RDW: 14.2 % (ref 11.7–15.4)
WBC: 5.5 10*3/uL (ref 3.4–10.8)

## 2023-10-19 LAB — IRON,TIBC AND FERRITIN PANEL
Ferritin: 32 ng/mL (ref 15–150)
Iron Saturation: 48 % (ref 15–55)
Iron: 189 ug/dL — ABNORMAL HIGH (ref 27–159)
Total Iron Binding Capacity: 393 ug/dL (ref 250–450)
UIBC: 204 ug/dL (ref 131–425)

## 2023-10-19 LAB — VITAMIN D 25 HYDROXY (VIT D DEFICIENCY, FRACTURES): Vit D, 25-Hydroxy: 12 ng/mL — ABNORMAL LOW (ref 30.0–100.0)

## 2023-10-19 MED ORDER — VITAMIN D (ERGOCALCIFEROL) 1.25 MG (50000 UNIT) PO CAPS: 50000.0000 [IU] | ORAL_CAPSULE | ORAL | 3 refills | Status: AC

## 2023-11-01 ENCOUNTER — Encounter: Payer: Self-pay | Admitting: Internal Medicine

## 2023-11-01 ENCOUNTER — Ambulatory Visit (INDEPENDENT_AMBULATORY_CARE_PROVIDER_SITE_OTHER): Admitting: Internal Medicine

## 2023-11-01 VITALS — BP 108/74 | HR 99 | Ht 67.0 in | Wt 198.6 lb

## 2023-11-01 DIAGNOSIS — J301 Allergic rhinitis due to pollen: Secondary | ICD-10-CM

## 2023-11-01 DIAGNOSIS — Z0130 Encounter for examination of blood pressure without abnormal findings: Secondary | ICD-10-CM

## 2023-11-01 DIAGNOSIS — D508 Other iron deficiency anemias: Secondary | ICD-10-CM | POA: Diagnosis not present

## 2023-11-01 DIAGNOSIS — E559 Vitamin D deficiency, unspecified: Secondary | ICD-10-CM | POA: Diagnosis not present

## 2023-11-01 DIAGNOSIS — F411 Generalized anxiety disorder: Secondary | ICD-10-CM

## 2023-11-01 NOTE — Progress Notes (Signed)
 Established Patient Office Visit  Subjective:  Patient ID: Allison [name], female    DOB: [birth year]  Age: 33 y.o. MRN: [id number]  Chief Complaint  Patient presents with   Follow-up    2 week follow up    Patient is here for her follow-up today.  Her labs showed a very low vitamin D  level, has been started on once a week supplement.  She continues to have low H&H with microcytosis.  Her sickle cell screen was negative.  Will check thalassemia trait.  She tried Paxil  tablet 10 mg only once but felt sleepy the next day also.  Advised to take it earlier in the evening and see how it affects her.  It will improve her panic attacks and jitteriness.  She has not yet returned the stool sample for possible ova and parasites, will try in a few days.    No other concerns at this time.   Past Medical History:  Diagnosis Date   Anemia    Asthma    exercise induced, inhaler use 8 years ago    Past Surgical History:  Procedure Laterality Date   WISDOM TOOTH EXTRACTION      Social History   Socioeconomic History   Marital status: Single    Spouse name: Not on file   Number of children: Not on file   Years of education: Not on file   Highest education level: Not on file  Occupational History    Employer: [employer]  Tobacco Use   Smoking status: Never    Passive exposure: Past   Smokeless tobacco: Never  Vaping Use   Vaping status: Never Used  Substance and Sexual Activity   Alcohol use: No   Drug use: No   Sexual activity: Yes    Birth control/protection: None    Comment: undecided  Other Topics Concern   Not on file  Social History Narrative   Not on file   Social Drivers of Health   Financial Resource Strain: Low Risk  (04/24/2018)   Overall Financial Resource Strain (CARDIA)    Difficulty of Paying Living Expenses: Not hard at all  Food Insecurity: No Food Insecurity (04/24/2018)   Hunger Vital Sign    Worried About Running Out of Food in the Last Year:  Never true    Ran Out of Food in the Last Year: Never true  Transportation Needs: No Transportation Needs (04/24/2018)   PRAPARE - Administrator, Civil Service (Medical): No    Lack of Transportation (Non-Medical): No  Physical Activity: Unknown (04/24/2018)   Exercise Vital Sign    Days of Exercise per Week: 3 days    Minutes of Exercise per Session: Not on file  Stress: No Stress Concern Present (04/24/2018)   [employer] of Occupational Health - Occupational Stress Questionnaire    Feeling of Stress : Not at all  Social Connections: Moderately Integrated (04/24/2018)   Social Connection and Isolation Panel [NHANES]    Frequency of Communication with Friends and Family: Three times a week    Frequency of Social Gatherings with Friends and Family: Three times a week    Attends Religious Services: 1 to 4 times per year    Active Member of Clubs or Organizations: No    Attends Banker Meetings: Never    Marital Status: Living with partner  Intimate Partner Violence: Unknown (04/24/2018)   Humiliation, Afraid, Rape, and Kick questionnaire    Fear of Current or Ex-Partner:  Not asked    Emotionally Abused: Not on file    Physically Abused: Not on file    Sexually Abused: Not on file    Family History  Problem Relation Age of Onset   Asthma Mother    Breast cancer Other    Ovarian cancer Neg Hx    Colon cancer Neg Hx    Diabetes Neg Hx     Allergies  Allergen Reactions   Dust Mite Extract Rash   Tree Extract Rash    Outpatient Medications Prior to Visit  Medication Sig   Iron -FA-B Cmp-C-Biot-Probiotic (FUSION PLUS) CAPS Take 1 capsule by mouth daily.   PARoxetine  (PAXIL ) 10 MG tablet Take 1 tablet (10 mg total) by mouth daily.   Vitamin D , Ergocalciferol , (DRISDOL ) 1.25 MG (50000 UNIT) CAPS capsule Take 1 capsule (50,000 Units total) by mouth every 7 (seven) days.   No facility-administered medications prior to visit.    Review of Systems   Constitutional:  Positive for malaise/fatigue. Negative for chills, diaphoresis, fever and weight loss.  HENT: Negative.  Negative for congestion.   Eyes: Negative.   Respiratory: Negative.  Negative for cough and shortness of breath.   Cardiovascular: Negative.  Negative for chest pain, palpitations and leg swelling.  Gastrointestinal: Negative.  Negative for abdominal pain, constipation, diarrhea, heartburn, nausea and vomiting.  Genitourinary: Negative.  Negative for dysuria and flank pain.  Musculoskeletal: Negative.  Negative for joint pain and myalgias.  Skin: Negative.   Neurological: Negative.  Negative for dizziness, tingling, tremors and headaches.  Endo/Heme/Allergies: Negative.   Psychiatric/Behavioral: Negative.  Negative for depression and suicidal ideas. The patient is not nervous/anxious.        Objective:   BP 108/74   Pulse 99   Ht 5\' 7"  (1.702 m)   Wt 198 lb 9.6 oz (90.1 kg)   SpO2 99%   BMI 31.11 kg/m   Vitals:   11/01/23 1349  BP: 108/74  Pulse: 99  Height: 5\' 7"  (1.702 m)  Weight: 198 lb 9.6 oz (90.1 kg)  SpO2: 99%  BMI (Calculated): 31.1    Physical Exam Vitals and nursing note reviewed.  Constitutional:      Appearance: Normal appearance.  HENT:     Head: Normocephalic and atraumatic.     Nose: Nose normal.     Mouth/Throat:     Mouth: Mucous membranes are moist.     Pharynx: Oropharynx is clear.  Eyes:     Conjunctiva/sclera: Conjunctivae normal.     Pupils: Pupils are equal, round, and reactive to light.  Cardiovascular:     Rate and Rhythm: Normal rate and regular rhythm.     Pulses: Normal pulses.     Heart sounds: Normal heart sounds. No murmur heard. Pulmonary:     Effort: Pulmonary effort is normal.     Breath sounds: Normal breath sounds. No wheezing.  Abdominal:     General: Bowel sounds are normal.     Palpations: Abdomen is soft.     Tenderness: There is no abdominal tenderness. There is no right CVA tenderness or left  CVA tenderness.  Musculoskeletal:        General: Normal range of motion.     Cervical back: Normal range of motion.     Right lower leg: No edema.     Left lower leg: No edema.  Skin:    General: Skin is warm and dry.  Neurological:     General: No focal deficit present.  Mental Status: She is alert and oriented to person, place, and time.  Psychiatric:        Mood and Affect: Mood normal.        Behavior: Behavior normal.      No results found for any visits on 11/01/23.  Recent Results (from the past 2160 hours)  CBC with Diff     Status: Abnormal   Collection Time: 10/18/23  2:22 PM  Result Value Ref Range   WBC 5.5 3.4 - 10.8 x10E3/uL   RBC 4.77 3.77 - 5.28 x10E6/uL   Hemoglobin 10.9 (L) 11.1 - 15.9 g/dL   Hematocrit 11.9 14.7 - 46.6 %   MCV 78 (L) 79 - 97 fL   MCH 22.9 (L) 26.6 - 33.0 pg   MCHC 29.5 (L) 31.5 - 35.7 g/dL   RDW 82.9 56.2 - 13.0 %   Platelets 245 150 - 450 x10E3/uL   Neutrophils 44 Not Estab. %   Lymphs 46 Not Estab. %   Monocytes 8 Not Estab. %   Eos 1 Not Estab. %   Basos 1 Not Estab. %   Neutrophils Absolute 2.4 1.4 - 7.0 x10E3/uL   Lymphocytes Absolute 2.6 0.7 - 3.1 x10E3/uL   Monocytes Absolute 0.4 0.1 - 0.9 x10E3/uL   EOS (ABSOLUTE) 0.1 0.0 - 0.4 x10E3/uL   Basophils Absolute 0.1 0.0 - 0.2 x10E3/uL   Immature Granulocytes 0 Not Estab. %   Immature Grans (Abs) 0.0 0.0 - 0.1 x10E3/uL  Iron , TIBC and Ferritin Panel     Status: Abnormal   Collection Time: 10/18/23  2:22 PM  Result Value Ref Range   Total Iron  Binding Capacity 393 250 - 450 ug/dL   UIBC 865 784 - 696 ug/dL   Iron  189 (H) 27 - 159 ug/dL   Iron  Saturation 48 15 - 55 %   Ferritin 32 15 - 150 ng/mL  Vitamin D  (25 hydroxy)     Status: Abnormal   Collection Time: 10/18/23  2:22 PM  Result Value Ref Range   Vit D, 25-Hydroxy 12.0 (L) 30.0 - 100.0 ng/mL    Comment: Vitamin D  deficiency has been defined by the Institute of Medicine and an Endocrine Society practice guideline as  a level of serum 25-OH vitamin D  less than 20 ng/mL (1,2). The Endocrine Society went on to further define vitamin D  insufficiency as a level between 21 and 29 ng/mL (2). 1. IOM (Institute of Medicine). 2010. Dietary reference    intakes for calcium and D. [place]: The    [employer]. 2. Holick MF, Binkley [place], Bischoff-Ferrari HA, et al.    Evaluation, treatment, and prevention of vitamin D     deficiency: an Endocrine Society clinical practice    guideline. JCEM. 2011 Jul; 96(7):1911-30.       Assessment & Plan:  Continue vitamin D  supplement.  Can reduce iron  supplement to twice a week.  Patient will try Paxil  10 mg again. Problem List Items Addressed This Visit     Iron  deficiency anemia - Primary   Relevant Orders   Alpha-Thalassemia Analysis   GAD (generalized anxiety disorder)   Seasonal allergic rhinitis due to pollen   Vitamin D  deficiency    Return in about 3 months (around 02/01/2024).   Total time spent: 25 minutes  Aisha Hove, MD  11/01/2023   This document may have been prepared by [facility] Voice Recognition software and as such may include unintentional dictation errors.

## 2023-11-02 ENCOUNTER — Ambulatory Visit: Payer: Managed Care, Other (non HMO) | Admitting: Internal Medicine

## 2023-11-06 ENCOUNTER — Emergency Department
Admission: EM | Admit: 2023-11-06 | Discharge: 2023-11-07 | Disposition: A | Discharge status: Home / Self Care | Attending: Emergency Medicine | Admitting: Emergency Medicine

## 2023-11-06 ENCOUNTER — Other Ambulatory Visit: Payer: Self-pay

## 2023-11-06 DIAGNOSIS — R55 Syncope and collapse: Secondary | ICD-10-CM | POA: Insufficient documentation

## 2023-11-06 DIAGNOSIS — R42 Dizziness and giddiness: Secondary | ICD-10-CM | POA: Insufficient documentation

## 2023-11-06 LAB — URINALYSIS, ROUTINE W REFLEX MICROSCOPIC
Bilirubin Urine: NEGATIVE
Glucose, UA: NEGATIVE mg/dL
Hgb urine dipstick: NEGATIVE
Ketones, ur: NEGATIVE mg/dL
Nitrite: NEGATIVE
Protein, ur: NEGATIVE mg/dL
Specific Gravity, Urine: 1.008 (ref 1.005–1.030)
pH: 6 (ref 5.0–8.0)

## 2023-11-06 LAB — CBC WITH DIFFERENTIAL/PLATELET
Abs Immature Granulocytes: 0.01 10*3/uL (ref 0.00–0.07)
Basophils Absolute: 0.1 10*3/uL (ref 0.0–0.1)
Basophils Relative: 1 %
Eosinophils Absolute: 0.1 10*3/uL (ref 0.0–0.5)
Eosinophils Relative: 2 %
HCT: 35.5 % — ABNORMAL LOW (ref 36.0–46.0)
Hemoglobin: 11.1 g/dL — ABNORMAL LOW (ref 12.0–15.0)
Immature Granulocytes: 0 %
Lymphocytes Relative: 45 %
Lymphs Abs: 2.3 10*3/uL (ref 0.7–4.0)
MCH: 23.1 pg — ABNORMAL LOW (ref 26.0–34.0)
MCHC: 31.3 g/dL (ref 30.0–36.0)
MCV: 74 fL — ABNORMAL LOW (ref 80.0–100.0)
Monocytes Absolute: 0.4 10*3/uL (ref 0.1–1.0)
Monocytes Relative: 8 %
Neutro Abs: 2.3 10*3/uL (ref 1.7–7.7)
Neutrophils Relative %: 44 %
Platelets: 282 10*3/uL (ref 150–400)
RBC: 4.8 MIL/uL (ref 3.87–5.11)
RDW: 14.6 % (ref 11.5–15.5)
WBC: 5.1 10*3/uL (ref 4.0–10.5)
nRBC: 0 % (ref 0.0–0.2)

## 2023-11-06 LAB — BASIC METABOLIC PANEL WITH GFR
Anion gap: 11 (ref 5–15)
BUN: 9 mg/dL (ref 6–20)
CO2: 24 mmol/L (ref 22–32)
Calcium: 9.3 mg/dL (ref 8.9–10.3)
Chloride: 103 mmol/L (ref 98–111)
Creatinine, Ser: 0.67 mg/dL (ref 0.44–1.00)
GFR, Estimated: >60 mL/min (ref >60)
Glucose, Bld: 97 mg/dL (ref 70–99)
Potassium: 4.1 mmol/L (ref 3.5–5.1)
Sodium: 138 mmol/L (ref 135–145)

## 2023-11-06 LAB — POC URINE PREG, ED: Preg Test, Ur: NEGATIVE
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
## 2023-11-07 ENCOUNTER — Telehealth: Payer: Self-pay | Admitting: Internal Medicine

## 2023-11-07 NOTE — Telephone Encounter (Signed)
 Patient left VM wanting to check on her lab results from the test that was done last week. I see an order for a test on 6/5 but no results - it looks like maybe it wasn't drawn by the lab. I will check with the lab tech today and see what the status is.

## 2023-11-09 ENCOUNTER — Other Ambulatory Visit: Payer: Self-pay | Admitting: Internal Medicine

## 2023-11-09 DIAGNOSIS — F411 Generalized anxiety disorder: Secondary | ICD-10-CM

## 2023-11-12 ENCOUNTER — Ambulatory Visit: Payer: Self-pay | Admitting: Internal Medicine

## 2023-11-15 ENCOUNTER — Encounter: Payer: Self-pay | Admitting: Internal Medicine

## 2023-11-15 ENCOUNTER — Other Ambulatory Visit: Payer: Self-pay | Admitting: Internal Medicine

## 2023-11-15 NOTE — Patient Instructions (Signed)
 Please review

## 2023-11-15 NOTE — Progress Notes (Signed)
 Thalassemia review

## 2024-02-01 ENCOUNTER — Ambulatory Visit: Admitting: Internal Medicine

## 2024-02-18 ENCOUNTER — Encounter: Payer: Self-pay | Admitting: Oncology

## 2024-02-18 ENCOUNTER — Encounter: Payer: Self-pay | Admitting: Internal Medicine

## 2024-02-18 ENCOUNTER — Ambulatory Visit (INDEPENDENT_AMBULATORY_CARE_PROVIDER_SITE_OTHER): Admitting: Internal Medicine

## 2024-02-18 VITALS — BP 102/76 | HR 87 | Ht 67.0 in | Wt 201.6 lb

## 2024-02-18 DIAGNOSIS — E782 Mixed hyperlipidemia: Secondary | ICD-10-CM | POA: Insufficient documentation

## 2024-02-18 DIAGNOSIS — R5383 Other fatigue: Secondary | ICD-10-CM | POA: Insufficient documentation

## 2024-02-18 DIAGNOSIS — R519 Headache, unspecified: Secondary | ICD-10-CM | POA: Insufficient documentation

## 2024-02-18 DIAGNOSIS — E559 Vitamin D deficiency, unspecified: Secondary | ICD-10-CM

## 2024-02-18 DIAGNOSIS — J301 Allergic rhinitis due to pollen: Secondary | ICD-10-CM

## 2024-02-18 DIAGNOSIS — D508 Other iron deficiency anemias: Secondary | ICD-10-CM | POA: Diagnosis not present

## 2024-02-18 DIAGNOSIS — F411 Generalized anxiety disorder: Secondary | ICD-10-CM

## 2024-02-18 MED ORDER — LORATADINE 10 MG PO TABS: 10.0000 mg | ORAL_TABLET | Freq: Every day | ORAL | 3 refills | Status: AC

## 2024-02-18 MED ORDER — FLONASE SENSIMIST 27.5 MCG/SPRAY NA SUSP: 2.0000 | Freq: Every day | NASAL | 12 refills | Status: AC

## 2024-02-18 NOTE — Progress Notes (Signed)
 Established Patient Office Visit  Subjective:  Patient ID: Allison [name], female    DOB: 1990/06/11  Age: 33 y.o. MRN: [id number]  Chief Complaint  Patient presents with   Follow-up    Discuss health issues    Patient is here today for headaches, fatigue, GERD. She states they began in July and have progressed in frequency. They are now daily. She is unsure if they are related to increased stress, or medication. She states tries to take Paxil  daily, but does miss doses. she She endorses long periods of screen time for work. She wears glasses for Myopia. States her last eye exam has been in the last year. Her father has a history of migraines. Encouraged patient to try blue light filtering glasses and getting updated eye exam.  Patient states she used to always feel cold but now feels hot since being on Paxil . Concerned she could be start perimenopause. Patient reports relatively normal periods but endorses slight fluctuation in cycle length. Patient reports periods got heavier with clotting after her second birth but denies any additional changes and her OB-GYN had reassured her that can happen after pregnancy. No family history of early menopause. Suspect that hot flashes are related to inconsistent Paxil  use.  She also endorses photophobia, pressure in her ethmoid sinuses and around her eyes. Denies an aura, nausea/vomiting, vision changes, or scalp tenderness.Reports not taking any tylenol  or ibuprofen  for symptom relief; states she goes to lay down in dark room until symptoms resolve. Denies fever, chills. Encouraged patient to start taking Claritin  and Flonase  daily as she had bilateral effusions behind TM. When symptoms arise, take OTC Tylenol  or Ibuprofen  for symptom relief, drink plenty of water.  Patient states she's been having GERD worse when she eats greasy food. She states she has made dietary changes and her symptoms have improved. States she just wanted us  aware. Discussed GERD  triggers including: stress, caffeine, chocolate, fried greasy foods, acidic foods, alcohol. Patient verbalized wanting to focus on dietary changes and discuss medication at a later date if symptoms persist.  Patient is due for routine blood work, will add sed rate. If symptoms fail to improve or persist will recommend imaging.    No other concerns at this time.   Past Medical History:  Diagnosis Date   Anemia    Asthma    exercise induced, inhaler use 8 years ago    Past Surgical History:  Procedure Laterality Date   WISDOM TOOTH EXTRACTION      Social History   Socioeconomic History   Marital status: Single    Spouse name: Not on file   Number of children: Not on file   Years of education: Not on file   Highest education level: Not on file  Occupational History    Employer: [employer]  Tobacco Use   Smoking status: Never    Passive exposure: Past   Smokeless tobacco: Never  Vaping Use   Vaping status: Never Used  Substance and Sexual Activity   Alcohol use: No   Drug use: No   Sexual activity: Yes    Birth control/protection: None    Comment: undecided  Other Topics Concern   Not on file  Social History Narrative   Not on file   Social Drivers of Health   Financial Resource Strain: Low Risk  (04/24/2018)   Overall Financial Resource Strain (CARDIA)    Difficulty of Paying Living Expenses: Not hard at all  Food Insecurity: No Food Insecurity (  04/24/2018)   Hunger Vital Sign    Worried About Running Out of Food in the Last Year: Never true    Ran Out of Food in the Last Year: Never true  Transportation Needs: No Transportation Needs (04/24/2018)   PRAPARE - Administrator, Civil Service (Medical): No    Lack of Transportation (Non-Medical): No  Physical Activity: Unknown (04/24/2018)   Exercise Vital Sign    Days of Exercise per Week: 3 days    Minutes of Exercise per Session: Not on file  Stress: No Stress Concern Present (04/24/2018)    [employer] of Occupational Health - Occupational Stress Questionnaire    Feeling of Stress : Not at all  Social Connections: Moderately Integrated (04/24/2018)   Social Connection and Isolation Panel    Frequency of Communication with Friends and Family: Three times a week    Frequency of Social Gatherings with Friends and Family: Three times a week    Attends Religious Services: 1 to 4 times per year    Active Member of Clubs or Organizations: No    Attends Banker Meetings: Never    Marital Status: Living with partner  Intimate Partner Violence: Unknown (04/24/2018)   Humiliation, Afraid, Rape, and Kick questionnaire    Fear of Current or Ex-Partner: Not asked    Emotionally Abused: Not on file    Physically Abused: Not on file    Sexually Abused: Not on file    Family History  Problem Relation Age of Onset   Asthma Mother    Breast cancer Other    Ovarian cancer Neg Hx    Colon cancer Neg Hx    Diabetes Neg Hx     Allergies  Allergen Reactions   Dust Mite Extract Rash   Tree Extract Rash    Outpatient Medications Prior to Visit  Medication Sig   Iron -FA-B Cmp-C-Biot-Probiotic (FUSION PLUS) CAPS Take 1 capsule by mouth daily.   PARoxetine  (PAXIL ) 10 MG tablet TAKE 1 TABLET BY MOUTH EVERY DAY   Vitamin D , Ergocalciferol , (DRISDOL ) 1.25 MG (50000 UNIT) CAPS capsule Take 1 capsule (50,000 Units total) by mouth every 7 (seven) days.   No facility-administered medications prior to visit.    Review of Systems  Constitutional:  Positive for malaise/fatigue. Negative for chills and fever.  HENT:  Positive for sinus pain. Negative for congestion, ear discharge, ear pain, hearing loss, sore throat and tinnitus.   Eyes:  Positive for photophobia. Negative for blurred vision, double vision, pain, discharge and redness.  Respiratory: Negative.  Negative for cough and shortness of breath.   Cardiovascular: Negative.  Negative for chest pain, palpitations and leg  swelling.  Gastrointestinal: Negative.  Negative for abdominal pain, blood in stool, constipation, diarrhea, heartburn, melena, nausea and vomiting.  Genitourinary: Negative.  Negative for dysuria, flank pain, frequency and urgency.  Musculoskeletal: Negative.  Negative for joint pain and myalgias.  Skin: Negative.   Neurological:  Positive for headaches. Negative for dizziness, tingling, sensory change, focal weakness and weakness.  Endo/Heme/Allergies:  Positive for environmental allergies.  Psychiatric/Behavioral:  Negative for depression and suicidal ideas. The patient is nervous/anxious.        Objective:   BP 102/76   Pulse 87   Ht 5' 7 (1.702 m)   Wt 201 lb 9.6 oz (91.4 kg)   SpO2 99%   BMI 31.58 kg/m   Vitals:   02/18/24 1048  BP: 102/76  Pulse: 87  Height: 5' 7 (1.702 m)  Weight: 201 lb 9.6 oz (91.4 kg)  SpO2: 99%  BMI (Calculated): 31.57    Physical Exam Vitals and nursing note reviewed.  Constitutional:      Appearance: Normal appearance.  HENT:     Head: Normocephalic and atraumatic.     Right Ear: Hearing normal. A middle ear effusion is present.     Left Ear: Hearing normal. A middle ear effusion is present.     Nose: Nose normal. No congestion or rhinorrhea.     Mouth/Throat:     Mouth: Mucous membranes are moist.     Pharynx: Oropharynx is clear.  Eyes:     Extraocular Movements: Extraocular movements intact.     Conjunctiva/sclera: Conjunctivae normal.     Pupils: Pupils are equal, round, and reactive to light.  Cardiovascular:     Rate and Rhythm: Normal rate and regular rhythm.     Pulses: Normal pulses.     Heart sounds: Normal heart sounds. No murmur heard. Pulmonary:     Effort: Pulmonary effort is normal.     Breath sounds: Normal breath sounds. No wheezing.  Abdominal:     General: Bowel sounds are normal.     Palpations: Abdomen is soft.     Tenderness: There is no abdominal tenderness. There is no right CVA tenderness or left CVA  tenderness.  Musculoskeletal:        General: Normal range of motion.     Cervical back: Normal range of motion.     Right lower leg: No edema.     Left lower leg: No edema.  Skin:    General: Skin is warm and dry.  Neurological:     General: No focal deficit present.     Mental Status: She is alert and oriented to person, place, and time.  Psychiatric:        Mood and Affect: Mood normal.        Behavior: Behavior normal.      No results found for any visits on 02/18/24.  No results found for this or any previous visit (from the past 2160 hours).    Assessment & Plan:  Start taking Claritin  10 mg once daily. Start using Flonase  nasal spray 1 spray in each nare daily. Continue taking other medications as prescribed. Will check routine blood work today with vitamin D  and Sed rate. Will FU with patient on results. Encouraged updated eye exam and blue light glasses for screen time. Encouraged take OTC tylenol  or ibuprofen  for headache. Will consider imaging at follow up if not better. Problem List Items Addressed This Visit     Iron  deficiency anemia   Relevant Orders   CBC with Diff   GAD (generalized anxiety disorder)   Seasonal allergic rhinitis due to pollen   Relevant Orders   CBC with Diff   CMP14+EGFR   Vitamin D  deficiency   Relevant Orders   Vitamin D  (25 hydroxy)   Headache around the eyes - Primary   Relevant Orders   Sed Rate (ESR)   Fatigue   Relevant Orders   TSH+T4F+T3Free   Mixed hyperlipidemia   Relevant Orders   Lipid Panel w/o Chol/HDL Ratio    Return in about 1 week (around 02/25/2024).   Total time spent: 30 minutes  FERNAND FREDY RAMAN, MD  02/18/2024   This document may have been prepared by [facility] Voice Recognition software and as such may include unintentional dictation errors.

## 2024-02-19 ENCOUNTER — Ambulatory Visit: Payer: Self-pay | Admitting: Internal Medicine

## 2024-02-19 LAB — CBC WITH DIFFERENTIAL/PLATELET
Basophils Absolute: 0 x10E3/uL (ref 0.0–0.2)
Basos: 1 %
EOS (ABSOLUTE): 0.1 x10E3/uL (ref 0.0–0.4)
Eos: 2 %
Hematocrit: 36.3 % (ref 34.0–46.6)
Hemoglobin: 10.8 g/dL — ABNORMAL LOW (ref 11.1–15.9)
Immature Grans (Abs): 0 x10E3/uL (ref 0.0–0.1)
Immature Granulocytes: 0 %
Lymphocytes Absolute: 1.9 x10E3/uL (ref 0.7–3.1)
Lymphs: 34 %
MCH: 23.1 pg — ABNORMAL LOW (ref 26.6–33.0)
MCHC: 29.8 g/dL — ABNORMAL LOW (ref 31.5–35.7)
MCV: 78 fL — ABNORMAL LOW (ref 79–97)
Monocytes Absolute: 0.4 x10E3/uL (ref 0.1–0.9)
Monocytes: 7 %
Neutrophils Absolute: 3.1 x10E3/uL (ref 1.4–7.0)
Neutrophils: 56 %
Platelets: 251 x10E3/uL (ref 150–450)
RBC: 4.67 x10E6/uL (ref 3.77–5.28)
RDW: 14.4 % (ref 11.7–15.4)
WBC: 5.5 x10E3/uL (ref 3.4–10.8)

## 2024-02-19 LAB — CMP14+EGFR
ALT: 12 IU/L (ref 0–32)
AST: 16 IU/L (ref 0–40)
Albumin: 4.2 g/dL (ref 3.9–4.9)
Alkaline Phosphatase: 62 IU/L (ref 41–116)
BUN/Creatinine Ratio: 12 (ref 9–23)
BUN: 8 mg/dL (ref 6–20)
Bilirubin Total: 0.3 mg/dL (ref 0.0–1.2)
CO2: 18 mmol/L — ABNORMAL LOW (ref 20–29)
Calcium: 9.2 mg/dL (ref 8.7–10.2)
Chloride: 104 mmol/L (ref 96–106)
Creatinine, Ser: 0.69 mg/dL (ref 0.57–1.00)
Globulin, Total: 2.5 g/dL (ref 1.5–4.5)
Glucose: 93 mg/dL (ref 70–99)
Potassium: 3.9 mmol/L (ref 3.5–5.2)
Sodium: 138 mmol/L (ref 134–144)
Total Protein: 6.7 g/dL (ref 6.0–8.5)
eGFR: 116 mL/min/1.73 (ref >59)

## 2024-02-19 LAB — LIPID PANEL W/O CHOL/HDL RATIO
Cholesterol, Total: 151 mg/dL (ref 100–199)
HDL: 49 mg/dL (ref >39)
LDL Chol Calc (NIH): 67 mg/dL (ref 0–99)
Triglycerides: 216 mg/dL — ABNORMAL HIGH (ref 0–149)
VLDL Cholesterol Cal: 35 mg/dL (ref 5–40)

## 2024-02-19 LAB — VITAMIN D 25 HYDROXY (VIT D DEFICIENCY, FRACTURES): Vit D, 25-Hydroxy: 22 ng/mL — ABNORMAL LOW (ref 30.0–100.0)

## 2024-02-19 LAB — SEDIMENTATION RATE: Sed Rate: 26 mm/h (ref 0–32)

## 2024-02-19 LAB — TSH+T4F+T3FREE
Free T4: 1.03 ng/dL (ref 0.82–1.77)
T3, Free: 2.8 pg/mL (ref 2.0–4.4)
TSH: 1.34 u[IU]/mL (ref 0.450–4.500)

## 2024-02-26 ENCOUNTER — Encounter: Payer: Self-pay | Admitting: Internal Medicine

## 2024-02-26 ENCOUNTER — Ambulatory Visit: Admitting: Internal Medicine

## 2024-02-26 VITALS — BP 102/76 | HR 82 | Ht 67.0 in | Wt 199.2 lb

## 2024-02-26 DIAGNOSIS — E782 Mixed hyperlipidemia: Secondary | ICD-10-CM

## 2024-02-26 DIAGNOSIS — D508 Other iron deficiency anemias: Secondary | ICD-10-CM

## 2024-02-26 DIAGNOSIS — J301 Allergic rhinitis due to pollen: Secondary | ICD-10-CM

## 2024-02-26 DIAGNOSIS — E559 Vitamin D deficiency, unspecified: Secondary | ICD-10-CM

## 2024-02-26 DIAGNOSIS — R519 Headache, unspecified: Secondary | ICD-10-CM

## 2024-02-26 DIAGNOSIS — F411 Generalized anxiety disorder: Secondary | ICD-10-CM

## 2024-02-26 MED ORDER — FUSION PLUS PO CAPS: 1.0000 | ORAL_CAPSULE | Freq: Every day | ORAL | 3 refills | Status: AC

## 2024-02-26 NOTE — Progress Notes (Signed)
 Established Patient Office Visit  Subjective:  Patient ID: Allison [name], female    DOB: [birth year]  Age: 33 y.o. MRN: [id number]  Chief Complaint  Patient presents with   Follow-up    1 week follow up    Patient is here today to follow up. She reports feeling better and states her headaches have improved. She reports her last headache was on 02/20/24. She is not sure if the allergy medicine and Flonase  helped or the reduction in screen time. Patient has not competed an eye exam yet; encouraged to still get that done as its been more than a year since her last eye exam. Will hold off on imaging with improvement in headaches at this time.  Discussed patients labs in detail and encouraged patient to start vitamin D  supplementation for vitamin D  deficiency. Patient has known iron  deficiency anemia and has been taking iron  supplement every other day as previously recommended. Will recommend patient increasing iron  supplementation to daily and FU in 3 months with lab work. Also reinforced healthy diet and exercise as tolerated to improve triglycerides.     No other concerns at this time.   Past Medical History:  Diagnosis Date   Anemia    Asthma    exercise induced, inhaler use 8 years ago    Past Surgical History:  Procedure Laterality Date   WISDOM TOOTH EXTRACTION      Social History   Socioeconomic History   Marital status: Single    Spouse name: Not on file   Number of children: Not on file   Years of education: Not on file   Highest education level: Not on file  Occupational History    Employer: [employer]  Tobacco Use   Smoking status: Never    Passive exposure: Past   Smokeless tobacco: Never  Vaping Use   Vaping status: Never Used  Substance and Sexual Activity   Alcohol use: No   Drug use: No   Sexual activity: Yes    Birth control/protection: None    Comment: undecided  Other Topics Concern   Not on file  Social History Narrative   Not on file    Social Drivers of Health   Financial Resource Strain: Low Risk  (04/24/2018)   Overall Financial Resource Strain (CARDIA)    Difficulty of Paying Living Expenses: Not hard at all  Food Insecurity: No Food Insecurity (04/24/2018)   Hunger Vital Sign    Worried About Running Out of Food in the Last Year: Never true    Ran Out of Food in the Last Year: Never true  Transportation Needs: No Transportation Needs (04/24/2018)   PRAPARE - Administrator, Civil Service (Medical): No    Lack of Transportation (Non-Medical): No  Physical Activity: Unknown (04/24/2018)   Exercise Vital Sign    Days of Exercise per Week: 3 days    Minutes of Exercise per Session: Not on file  Stress: No Stress Concern Present (04/24/2018)   [employer] of Occupational Health - Occupational Stress Questionnaire    Feeling of Stress : Not at all  Social Connections: Moderately Integrated (04/24/2018)   Social Connection and Isolation Panel    Frequency of Communication with Friends and Family: Three times a week    Frequency of Social Gatherings with Friends and Family: Three times a week    Attends Religious Services: 1 to 4 times per year    Active Member of Clubs or Organizations: No  Attends Banker Meetings: Never    Marital Status: Living with partner  Intimate Partner Violence: Unknown (04/24/2018)   Humiliation, Afraid, Rape, and Kick questionnaire    Fear of Current or Ex-Partner: Not asked    Emotionally Abused: Not on file    Physically Abused: Not on file    Sexually Abused: Not on file    Family History  Problem Relation Age of Onset   Asthma Mother    Breast cancer Other    Ovarian cancer Neg Hx    Colon cancer Neg Hx    Diabetes Neg Hx     Allergies  Allergen Reactions   Dust Mite Extract Rash   Tree Extract Rash    Outpatient Medications Prior to Visit  Medication Sig   fluticasone (FLONASE  SENSIMIST) 27.5 MCG/SPRAY nasal spray Place 2 sprays  into the nose daily.   loratadine  (CLARITIN ) 10 MG tablet Take 1 tablet (10 mg total) by mouth daily.   PARoxetine  (PAXIL ) 10 MG tablet TAKE 1 TABLET BY MOUTH EVERY DAY   Vitamin D , Ergocalciferol , (DRISDOL ) 1.25 MG (50000 UNIT) CAPS capsule Take 1 capsule (50,000 Units total) by mouth every 7 (seven) days.   [DISCONTINUED] Iron -FA-B Cmp-C-Biot-Probiotic (FUSION PLUS) CAPS Take 1 capsule by mouth daily.   No facility-administered medications prior to visit.    Review of Systems  Constitutional: Negative.  Negative for chills, fever and malaise/fatigue.  HENT: Negative.  Negative for congestion and sore throat.   Eyes: Negative.  Negative for blurred vision and pain.  Respiratory: Negative.  Negative for cough and shortness of breath.   Cardiovascular: Negative.  Negative for chest pain, palpitations and leg swelling.  Gastrointestinal: Negative.  Negative for abdominal pain, blood in stool, constipation, diarrhea, heartburn, melena, nausea and vomiting.  Genitourinary: Negative.  Negative for dysuria, flank pain, frequency and urgency.  Musculoskeletal: Negative.  Negative for joint pain and myalgias.  Skin: Negative.   Neurological: Negative.  Negative for dizziness, tingling, sensory change, weakness and headaches.  Endo/Heme/Allergies: Negative.   Psychiatric/Behavioral: Negative.  Negative for depression and suicidal ideas. The patient is not nervous/anxious.        Objective:   BP 102/76   Pulse 82   Ht 5' 7 (1.702 m)   Wt 199 lb 3.2 oz (90.4 kg)   SpO2 97%   BMI 31.20 kg/m   Vitals:   02/26/24 1146  BP: 102/76  Pulse: 82  Height: 5' 7 (1.702 m)  Weight: 199 lb 3.2 oz (90.4 kg)  SpO2: 97%  BMI (Calculated): 31.19    Physical Exam Vitals and nursing note reviewed.  Constitutional:      Appearance: Normal appearance.  HENT:     Head: Normocephalic and atraumatic.     Nose: Nose normal.     Mouth/Throat:     Mouth: Mucous membranes are moist.     Pharynx:  Oropharynx is clear.  Eyes:     Conjunctiva/sclera: Conjunctivae normal.     Pupils: Pupils are equal, round, and reactive to light.  Cardiovascular:     Rate and Rhythm: Normal rate and regular rhythm.     Pulses: Normal pulses.     Heart sounds: Normal heart sounds. No murmur heard. Pulmonary:     Effort: Pulmonary effort is normal.     Breath sounds: Normal breath sounds. No wheezing.  Abdominal:     General: Bowel sounds are normal.     Palpations: Abdomen is soft.     Tenderness: There is  no abdominal tenderness. There is no right CVA tenderness or left CVA tenderness.  Musculoskeletal:        General: Normal range of motion.     Cervical back: Normal range of motion.     Right lower leg: No edema.     Left lower leg: No edema.  Skin:    General: Skin is warm and dry.  Neurological:     General: No focal deficit present.     Mental Status: She is alert and oriented to person, place, and time.  Psychiatric:        Mood and Affect: Mood normal.        Behavior: Behavior normal.      No results found for any visits on 02/26/24.  Recent Results (from the past 2160 hours)  Vitamin D  (25 hydroxy)     Status: Abnormal   Collection Time: 02/18/24 11:44 AM  Result Value Ref Range   Vit D, 25-Hydroxy 22.0 (L) 30.0 - 100.0 ng/mL    Comment: Vitamin D  deficiency has been defined by the Institute of Medicine and an Endocrine Society practice guideline as a level of serum 25-OH vitamin D  less than 20 ng/mL (1,2). The Endocrine Society went on to further define vitamin D  insufficiency as a level between 21 and 29 ng/mL (2). 1. IOM (Institute of Medicine). 2010. Dietary reference    intakes for calcium and D. [place]: The    [employer]. 2. Holick MF, Binkley [place], Bischoff-Ferrari HA, et al.    Evaluation, treatment, and prevention of vitamin D     deficiency: an Endocrine Society clinical practice    guideline. JCEM. 2011 Jul; 96(7):1911-30.   CBC with Diff      Status: Abnormal   Collection Time: 02/18/24 11:44 AM  Result Value Ref Range   WBC 5.5 3.4 - 10.8 x10E3/uL   RBC 4.67 3.77 - 5.28 x10E6/uL   Hemoglobin 10.8 (L) 11.1 - 15.9 g/dL   Hematocrit 63.6 65.9 - 46.6 %   MCV 78 (L) 79 - 97 fL   MCH 23.1 (L) 26.6 - 33.0 pg   MCHC 29.8 (L) 31.5 - 35.7 g/dL   RDW 85.5 88.2 - 84.5 %   Platelets 251 150 - 450 x10E3/uL   Neutrophils 56 Not Estab. %   Lymphs 34 Not Estab. %   Monocytes 7 Not Estab. %   Eos 2 Not Estab. %   Basos 1 Not Estab. %   Neutrophils Absolute 3.1 1.4 - 7.0 x10E3/uL   Lymphocytes Absolute 1.9 0.7 - 3.1 x10E3/uL   Monocytes Absolute 0.4 0.1 - 0.9 x10E3/uL   EOS (ABSOLUTE) 0.1 0.0 - 0.4 x10E3/uL   Basophils Absolute 0.0 0.0 - 0.2 x10E3/uL   Immature Granulocytes 0 Not Estab. %   Immature Grans (Abs) 0.0 0.0 - 0.1 x10E3/uL  CMP14+EGFR     Status: Abnormal   Collection Time: 02/18/24 11:44 AM  Result Value Ref Range   Glucose 93 70 - 99 mg/dL   BUN 8 6 - 20 mg/dL   Creatinine, Ser 9.30 0.57 - 1.00 mg/dL   eGFR 883 >40 fO/fpw/8.26   BUN/Creatinine Ratio 12 9 - 23   Sodium 138 134 - 144 mmol/L   Potassium 3.9 3.5 - 5.2 mmol/L   Chloride 104 96 - 106 mmol/L   CO2 18 (L) 20 - 29 mmol/L   Calcium 9.2 8.7 - 10.2 mg/dL   Total Protein 6.7 6.0 - 8.5 g/dL   Albumin 4.2 3.9 -  4.9 g/dL   Globulin, Total 2.5 1.5 - 4.5 g/dL   Bilirubin Total 0.3 0.0 - 1.2 mg/dL   Alkaline Phosphatase 62 41 - 116 IU/L    Comment:               **Please note reference interval change**   AST 16 0 - 40 IU/L   ALT 12 0 - 32 IU/L  Lipid Panel w/o Chol/HDL Ratio     Status: Abnormal   Collection Time: 02/18/24 11:44 AM  Result Value Ref Range   Cholesterol, Total 151 100 - 199 mg/dL   Triglycerides 783 (H) 0 - 149 mg/dL   HDL 49 >60 mg/dL   VLDL Cholesterol Cal 35 5 - 40 mg/dL   LDL Chol Calc (NIH) 67 0 - 99 mg/dL  Sed Rate (ESR)     Status: None   Collection Time: 02/18/24 11:44 AM  Result Value Ref Range   Sed Rate 26 0 - 32 mm/hr   TSH+T4F+T3Free     Status: None   Collection Time: 02/18/24 11:44 AM  Result Value Ref Range   TSH 1.340 0.450 - 4.500 uIU/mL   T3, Free 2.8 2.0 - 4.4 pg/mL   Free T4 1.03 0.82 - 1.77 ng/dL      Assessment & Plan:  Increase iron  supplementation to daily. Continue taking medications as prescribed. Encouraged annual eye exam. Will repeat labs in 3 months. Encouraged patient to reach out sooner than 3 months if headaches returned, became worse of if needed for additional concerns prior to next appointment. Problem List Items Addressed This Visit     Iron  deficiency anemia   Relevant Medications   Iron -FA-B Cmp-C-Biot-Probiotic (FUSION PLUS) CAPS   GAD (generalized anxiety disorder)   Seasonal allergic rhinitis due to pollen   Vitamin D  deficiency   Headache around the eyes   Mixed hyperlipidemia - Primary    Return in about 3 months (around 05/27/2024).   Total time spent: 30 minutes  FERNAND FREDY RAMAN, MD  02/26/2024   This document may have been prepared by [facility] Voice Recognition software and as such may include unintentional dictation errors.

## 2024-03-03 ENCOUNTER — Encounter: Payer: Self-pay | Admitting: Oncology

## 2024-03-03 ENCOUNTER — Ambulatory Visit
Admission: EM | Admit: 2024-03-03 | Discharge: 2024-03-03 | Disposition: A | Discharge status: Home / Self Care | Attending: Emergency Medicine | Admitting: Emergency Medicine

## 2024-03-03 ENCOUNTER — Encounter: Payer: Self-pay | Admitting: Emergency Medicine

## 2024-03-03 ENCOUNTER — Telehealth: Payer: Self-pay

## 2024-03-03 DIAGNOSIS — N898 Other specified noninflammatory disorders of vagina: Secondary | ICD-10-CM | POA: Insufficient documentation

## 2024-03-03 DIAGNOSIS — N751 Abscess of Bartholin's gland: Secondary | ICD-10-CM | POA: Insufficient documentation

## 2024-03-03 MED ORDER — DOXYCYCLINE HYCLATE 100 MG PO CAPS: 100.0000 mg | ORAL_CAPSULE | Freq: Two times a day (BID) | ORAL | 0 refills | Status: AC

## 2024-03-03 NOTE — Telephone Encounter (Signed)
 TRIAGE VOICEMAIL: Patient states she is experiencing intense discomfort and pain. She is unable to be seen until 03/18/24. Requesting advice.

## 2024-03-03 NOTE — Telephone Encounter (Signed)
 I spoke with patient and recommended patient to go to UC for evaluation, since she is having a lot of pain and difficulty walking. I also told her that we could put her on cancellation list. She verbalized understanding.

## 2024-03-03 NOTE — ED Triage Notes (Signed)
 Patient reports cyst or abscess on left labia  x 5 days. Patient has been doing hot soaks with no relief. Rates pain 6/10.

## 2024-03-03 NOTE — ED Provider Notes (Signed)
 [facility]    CSN: [id number] Arrival date & time: 03/03/24  1228      History   Chief Complaint Chief Complaint  Patient presents with   Abscess    HPI Allison [name] is a 33 y.o. female.   Patient presents for evaluation of a cyst to the left labia present for 5 days.  Has increased in size and become painful making it difficult to walk and sit.  Has applied warm compresses.  Endorses has occurred once before approximately 7 years ago.  Denies fever or drainage.  Past Medical History:  Diagnosis Date   Anemia    Asthma    exercise induced, inhaler use 8 years ago    Patient Active Problem List   Diagnosis Date Noted   Headache around the eyes 02/18/2024   Fatigue 02/18/2024   Mixed hyperlipidemia 02/18/2024   Vitamin D  deficiency 11/01/2023   Dizziness 01/11/2023   GAD (generalized anxiety disorder) 01/11/2023   Seasonal allergic rhinitis due to pollen 01/11/2023   Mild intermittent asthma without complication 01/11/2023   Iron  deficiency anemia 03/13/2018    Past Surgical History:  Procedure Laterality Date   WISDOM TOOTH EXTRACTION      OB History     Gravida  3   Para  2   Term  2   Preterm      AB      Living  2      SAB      IAB      Ectopic      Multiple  0   Live Births  2            Home Medications    Prior to Admission medications   Medication Sig Start Date End Date Taking? Authorizing Provider  doxycycline (VIBRAMYCIN) 100 MG capsule Take 1 capsule (100 mg total) by mouth 2 (two) times daily. 03/03/24  Yes Grover Robinson R, NP  fluticasone (FLONASE  SENSIMIST) 27.5 MCG/SPRAY nasal spray Place 2 sprays into the nose daily. 02/18/24   Fernand Fredy RAMAN, MD  Iron -FA-B Cmp-C-Biot-Probiotic (FUSION PLUS) CAPS Take 1 capsule by mouth daily. 02/26/24   Fernand Fredy RAMAN, MD  loratadine  (CLARITIN ) 10 MG tablet Take 1 tablet (10 mg total) by mouth daily. 02/18/24 02/17/25  Fernand Fredy RAMAN, MD  PARoxetine  (PAXIL ) 10 MG  tablet TAKE 1 TABLET BY MOUTH EVERY DAY 11/12/23   Fernand Fredy RAMAN, MD  Vitamin D , Ergocalciferol , (DRISDOL ) 1.25 MG (50000 UNIT) CAPS capsule Take 1 capsule (50,000 Units total) by mouth every 7 (seven) days. 10/19/23   Fernand Fredy RAMAN, MD    Family History Family History  Problem Relation Age of Onset   Asthma Mother    Breast cancer Other    Ovarian cancer Neg Hx    Colon cancer Neg Hx    Diabetes Neg Hx     Social History Social History   Tobacco Use   Smoking status: Never    Passive exposure: Past   Smokeless tobacco: Never  Vaping Use   Vaping status: Never Used  Substance Use Topics   Alcohol use: No   Drug use: No     Allergies   Dust mite extract and Tree extract   Review of Systems Review of Systems   Physical Exam Triage Vital Signs ED Triage Vitals  Encounter Vitals Group     BP 03/03/24 1311 104/76     Girls Systolic BP Percentile --      Girls  Diastolic BP Percentile --      Boys Systolic BP Percentile --      Boys Diastolic BP Percentile --      Pulse Rate 03/03/24 1311 100     Resp 03/03/24 1311 20     Temp 03/03/24 1311 98.8 F (37.1 C)     Temp Source 03/03/24 1311 Oral     SpO2 03/03/24 1311 98 %     Weight --      Height --      Head Circumference --      Peak Flow --      Pain Score 03/03/24 1308 6     Pain Loc --      Pain Education --      Exclude from Growth Chart --    No data found.  Updated Vital Signs BP 104/76 (BP Location: Left Arm)   Pulse 100   Temp 98.8 F (37.1 C) (Oral)   Resp 20   LMP 02/20/2024 (Exact Date)   SpO2 98%   Breastfeeding No   Visual Acuity Right Eye Distance:   Left Eye Distance:   Bilateral Distance:    Right Eye Near:   Left Eye Near:    Bilateral Near:     Physical Exam Constitutional:      Appearance: Normal appearance.  Eyes:     Extraocular Movements: Extraocular movements intact.  Pulmonary:     Effort: Pulmonary effort is normal.  Genitourinary:    Comments: 2 x 3 inflamed  Bartholin's cyst present to the left side, copious amounts of Vonzell Lindblad discharge to the vaginal canal Neurological:     Mental Status: She is alert and oriented to person, place, and time. Mental status is at baseline.      UC Treatments / Results  Labs (all labs ordered are listed, but only abnormal results are displayed) Labs Reviewed  CERVICOVAGINAL ANCILLARY ONLY    EKG   Radiology No results found.  Procedures Procedures (including critical care time)  Medications Ordered in UC Medications - No data to display  Initial Impression / Assessment and Plan / UC Course  I have reviewed the triage vital signs and the nursing notes.  Pertinent labs & imaging results that were available during my care of the patient were reviewed by me and considered in my medical decision making (see chart for details).  Abscess of left Bartholin's gland, vaginal discharge  I&D completed, tolerated well, prescribed doxycycline recommended continue warm compresses and over-the-counter analgesics with follow-up for nonhealing site   STI labs pending will treat per protocol, advised abstinence until lab results, and/or treatment is complete, advised condom use during all sexual encounters moving, may follow-up with urgent care as needed  Final Clinical Impressions(s) / UC Diagnoses   Final diagnoses:  Abscess of left Bartholin's gland  Vaginal discharge     Discharge Instructions      Abscess has been drained here in the clinic to help relieve pressure and pain  Take doxycycline twice daily for 10 days  Hold warm-hot compresses to affected area at least 4 times a day, this helps to facilitate draining, the more the better  Please return for evaluation for increased swelling, increased tenderness or pain, non healing site, non draining site, you begin to have fever or chills   We reviewed the etiology of recurrent abscesses of skin.  Skin abscesses are collections of pus within the  dermis and deeper skin tissues. Skin abscesses manifest as painful, tender, fluctuant,  and erythematous nodules, frequently surmounted by a pustule and surrounded by a rim of erythematous swelling.  Spontaneous drainage of purulent material may occur.  Fever can occur on occasion.    -Skin abscesses can develop in healthy individuals with no predisposing conditions other than skin or nasal carriage of Staphylococcus aureus.  Individuals in close contact with others who have active infection with skin abscesses are at increased risk which is likely to explain why twin brother has similar episodes.   In addition, any process leading to a breach in the skin barrier can also predispose to the development of a skin abscesses, such as atopic dermatitis.      ED Prescriptions     Medication Sig Dispense Auth. Provider   doxycycline (VIBRAMYCIN) 100 MG capsule Take 1 capsule (100 mg total) by mouth 2 (two) times daily. 20 capsule Gill Delrossi R, NP      PDMP not reviewed this encounter.   Teresa Shelba SAUNDERS, NP 03/03/24 1407

## 2024-03-03 NOTE — Discharge Instructions (Signed)
 Abscess has been drained here in the clinic to help relieve pressure and pain  Take doxycycline twice daily for 10 days  Hold warm-hot compresses to affected area at least 4 times a day, this helps to facilitate draining, the more the better  Please return for evaluation for increased swelling, increased tenderness or pain, non healing site, non draining site, you begin to have fever or chills   We reviewed the etiology of recurrent abscesses of skin.  Skin abscesses are collections of pus within the dermis and deeper skin tissues. Skin abscesses manifest as painful, tender, fluctuant, and erythematous nodules, frequently surmounted by a pustule and surrounded by a rim of erythematous swelling.  Spontaneous drainage of purulent material may occur.  Fever can occur on occasion.    -Skin abscesses can develop in healthy individuals with no predisposing conditions other than skin or nasal carriage of Staphylococcus aureus.  Individuals in close contact with others who have active infection with skin abscesses are at increased risk which is likely to explain why twin brother has similar episodes.   In addition, any process leading to a breach in the skin barrier can also predispose to the development of a skin abscesses, such as atopic dermatitis.

## 2024-03-03 NOTE — Telephone Encounter (Signed)
 Spoke with patient. She states she has a bartholin cysyt. She had one about seven years ago. She started having discomfort on Thursday evening. She noticed inflammation on Friday. She reports it is about the size of a quarter. Information sent via my chart for Bartholin Cyst. Discussed use of heating pad/sitz bath. Will send message to Dr. Leigh for review.

## 2024-03-05 ENCOUNTER — Ambulatory Visit: Admitting: Obstetrics

## 2024-03-05 ENCOUNTER — Ambulatory Visit (HOSPITAL_COMMUNITY): Payer: Self-pay

## 2024-03-05 LAB — CERVICOVAGINAL ANCILLARY ONLY
Bacterial Vaginitis (gardnerella): POSITIVE — AB
Candida Glabrata: NEGATIVE
Candida Vaginitis: NEGATIVE
Chlamydia: NEGATIVE
Comment: NEGATIVE
Comment: NEGATIVE
Comment: NEGATIVE
Comment: NEGATIVE
Comment: NEGATIVE
Comment: NORMAL
Neisseria Gonorrhea: NEGATIVE
Trichomonas: NEGATIVE

## 2024-03-05 MED ORDER — METRONIDAZOLE 500 MG PO TABS: 500.0000 mg | ORAL_TABLET | Freq: Two times a day (BID) | ORAL | 0 refills | Status: AC

## 2024-03-07 ENCOUNTER — Telehealth: Payer: Self-pay | Admitting: Internal Medicine

## 2024-03-07 NOTE — Telephone Encounter (Signed)
 Patient left VM stating she misplaced her medication and is not due for a refill until 11/12. She wants to know if we can send in the medication. Did not state which med she is referring to.

## 2024-03-12 NOTE — Telephone Encounter (Signed)
 Number is no longer valid.

## 2024-03-18 ENCOUNTER — Ambulatory Visit: Admitting: Obstetrics & Gynecology

## 2024-04-28 ENCOUNTER — Encounter: Payer: Self-pay | Admitting: Internal Medicine

## 2024-04-29 ENCOUNTER — Encounter: Payer: Self-pay | Admitting: Internal Medicine

## 2024-05-14 ENCOUNTER — Encounter: Payer: Self-pay | Admitting: Oncology

## 2024-05-21 ENCOUNTER — Encounter: Payer: Self-pay | Admitting: Oncology

## 2024-05-27 ENCOUNTER — Ambulatory Visit: Admitting: Internal Medicine
